# Patient Record
Sex: Male | Born: 2000 | Race: Black or African American | Hispanic: No | Marital: Single | Smoking: Never smoker
Health system: Southern US, Community
[De-identification: ages and names within clinical notes are randomized; demographics above are authoritative.]

## PROBLEM LIST (undated history)

## (undated) DIAGNOSIS — T7840XA Allergy, unspecified, initial encounter: Secondary | ICD-10-CM

## (undated) DIAGNOSIS — F329 Major depressive disorder, single episode, unspecified: Secondary | ICD-10-CM

## (undated) DIAGNOSIS — D649 Anemia, unspecified: Secondary | ICD-10-CM

## (undated) DIAGNOSIS — K5190 Ulcerative colitis, unspecified, without complications: Secondary | ICD-10-CM

## (undated) DIAGNOSIS — Z5189 Encounter for other specified aftercare: Secondary | ICD-10-CM

## (undated) DIAGNOSIS — F909 Attention-deficit hyperactivity disorder, unspecified type: Secondary | ICD-10-CM

## (undated) DIAGNOSIS — J189 Pneumonia, unspecified organism: Secondary | ICD-10-CM

## (undated) DIAGNOSIS — L309 Dermatitis, unspecified: Secondary | ICD-10-CM

## (undated) DIAGNOSIS — F32A Depression, unspecified: Secondary | ICD-10-CM

## (undated) DIAGNOSIS — F419 Anxiety disorder, unspecified: Secondary | ICD-10-CM

## (undated) HISTORY — DX: Encounter for other specified aftercare: Z51.89

## (undated) HISTORY — PX: NO PAST SURGERIES: SHX2092

## (undated) HISTORY — DX: Anxiety disorder, unspecified: F41.9

## (undated) HISTORY — DX: Depression, unspecified: F32.A

## (undated) HISTORY — DX: Pneumonia, unspecified organism: J18.9

## (undated) HISTORY — DX: Ulcerative colitis, unspecified, without complications: K51.90

## (undated) HISTORY — DX: Major depressive disorder, single episode, unspecified: F32.9

## (undated) HISTORY — DX: Allergy, unspecified, initial encounter: T78.40XA

## (undated) HISTORY — DX: Anemia, unspecified: D64.9

---
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
## 2020-02-02 ENCOUNTER — Encounter: Payer: Self-pay | Admitting: Family Medicine

## 2020-02-02 ENCOUNTER — Ambulatory Visit (INDEPENDENT_AMBULATORY_CARE_PROVIDER_SITE_OTHER): Payer: Managed Care, Other (non HMO) | Admitting: Family Medicine

## 2020-02-02 ENCOUNTER — Other Ambulatory Visit: Payer: Self-pay

## 2020-02-02 VITALS — BP 122/81 | HR 72 | Temp 98.4°F | Wt 180.0 lb

## 2020-02-02 DIAGNOSIS — F4323 Adjustment disorder with mixed anxiety and depressed mood: Secondary | ICD-10-CM | POA: Diagnosis not present

## 2020-02-02 NOTE — Progress Notes (Signed)
BP 122/81   Pulse 72   Temp 98.4 F (36.9 C) (Oral)   Wt 180 lb (81.6 kg)   SpO2 98%    Subjective:    Patient ID: Aaron Sawyer, male    DOB: 07/31/2000, 19 y.o.   MRN: [id number]  HPI: Aaron Sawyer is a 19 y.o. male  Chief Complaint  Patient presents with  . Anxiety  . Depression   ANXIETY/DEPRESSION- has been having a lot of family issues that have been pushing on him and making him not feel good, has been feeling down and has been anxious around new people  Duration: couple of months Status:exacerbated Anxious mood: yes  Excessive worrying: yes Irritability: yes  Sweating: no Nausea: no Palpitations:no Hyperventilation: no Panic attacks: no Agoraphobia: no  Obscessions/compulsions: no Depressed mood: yes Depression screen [facility] 2/9 02/02/2020 10/18/2018 03/16/2017 01/05/2017  Decreased Interest 1 1 0 3  Down, Depressed, Hopeless 1 0 0 0  PHQ - 2 Score 2 1 0 3  Altered sleeping 0 0 0 0  Tired, decreased energy 1 0 0 0  Change in appetite 0 0 0 0  Feeling bad or failure about yourself  1 0 0 0  Trouble concentrating 0 0 0 0  Moving slowly or fidgety/restless 0 0 0 0  Suicidal thoughts 0 0 0 0  PHQ-9 Score 4 1 0 3  Difficult doing work/chores Somewhat difficult Not difficult at all Not difficult at all Somewhat difficult   GAD 7 : Generalized Anxiety Score 02/02/2020 10/18/2018  Nervous, Anxious, on Edge 1 0  Control/stop worrying 1 0  Worry too much - different things 1 0  Trouble relaxing 0 0  Restless 1 0  Easily annoyed or irritable 0 0  Afraid - awful might happen 1 0  Total GAD 7 Score 5 0  Anxiety Difficulty Somewhat difficult Not difficult at all   Anhedonia: no Weight changes: no Insomnia: no   Hypersomnia: no Fatigue/loss of energy: yes Feelings of worthlessness: no Feelings of guilt: no Impaired concentration/indecisiveness: no Suicidal ideations: no  Crying spells: no Recent Stressors/Life Changes: yes   Relationship problems: no   Family  stress: yes     Financial stress: no    Job stress: no    Recent death/loss: yes  Relevant past medical, surgical, family and social history reviewed and updated as indicated. Interim medical history since our last visit reviewed. Allergies and medications reviewed and updated.  Review of Systems  Constitutional: Negative.   Respiratory: Negative.   Cardiovascular: Negative.   Gastrointestinal: Negative.   Musculoskeletal: Negative.   Psychiatric/Behavioral: Negative.     Per HPI unless specifically indicated above     Objective:    BP 122/81   Pulse 72   Temp 98.4 F (36.9 C) (Oral)   Wt 180 lb (81.6 kg)   SpO2 98%   Wt Readings from Last 3 Encounters:  02/02/20 180 lb (81.6 kg) (83 %, Z= 0.93)*  10/18/18 172 lb (78 kg) (81 %, Z= 0.87)*  09/20/17 173 lb (78.5 kg) (87 %, Z= 1.11)*   * Growth percentiles are based on CDC (Boys, 2-20 Years) data.    Physical Exam Vitals and nursing note reviewed.  Constitutional:      General: He is not in acute distress.    Appearance: Normal appearance. He is not ill-appearing, toxic-appearing or diaphoretic.  HENT:     Head: Normocephalic and atraumatic.     Right Ear: External ear normal.  Left Ear: External ear normal.     Nose: Nose normal.     Mouth/Throat:     Mouth: Mucous membranes are moist.     Pharynx: Oropharynx is clear.  Eyes:     General: No scleral icterus.       Right eye: No discharge.        Left eye: No discharge.     Extraocular Movements: Extraocular movements intact.     Conjunctiva/sclera: Conjunctivae normal.     Pupils: Pupils are equal, round, and reactive to light.  Cardiovascular:     Rate and Rhythm: Normal rate and regular rhythm.     Pulses: Normal pulses.     Heart sounds: Normal heart sounds. No murmur heard.  No friction rub. No gallop.   Pulmonary:     Effort: Pulmonary effort is normal. No respiratory distress.     Breath sounds: Normal breath sounds. No stridor. No wheezing,  rhonchi or rales.  Chest:     Chest wall: No tenderness.  Musculoskeletal:        General: Normal range of motion.     Cervical back: Normal range of motion and neck supple.  Skin:    General: Skin is warm and dry.     Capillary Refill: Capillary refill takes less than 2 seconds.     Coloration: Skin is not jaundiced or pale.     Findings: No bruising, erythema, lesion or rash.  Neurological:     General: No focal deficit present.     Mental Status: He is alert and oriented to person, place, and time. Mental status is at baseline.  Psychiatric:        Mood and Affect: Mood normal.        Behavior: Behavior normal.        Thought Content: Thought content normal.        Judgment: Judgment normal.     Results for orders placed or performed in visit on 10/23/18  Iron and TIBC  Result Value Ref Range   Total Iron Binding Capacity 344 250 - 450 ug/dL   UIBC 324 148 - 395 ug/dL   Iron 20 (L) 26 - 169 ug/dL   Iron Saturation 6 (LL) 15 - 55 %  Ferritin  Result Value Ref Range   Ferritin 5 (L) 16.0 - 124.0 ng/mL  VITAMIN D 25 Hydroxy (Vit-D Deficiency, Fractures)  Result Value Ref Range   Vit D, 25-Hydroxy 20.0 (L) 30.0 - 100.0 ng/mL  Sed Rate (ESR)  Result Value Ref Range   Sed Rate 12 0 - 15 mm/hr  C-reactive protein  Result Value Ref Range   CRP 4 0 - 7 mg/L      Assessment & Plan:   Problem List Items Addressed This Visit    None    Visit Diagnoses    Adjustment disorder with mixed anxiety and depressed mood    -  Primary   Would like to try therapy. If getting worse, will consider medication. Call with any concerns. Continue to monitor.        Follow up plan: Return in about 4 weeks (around 03/01/2020) for Physical.

## 2020-04-02 ENCOUNTER — Emergency Department
Admission: EM | Admit: 2020-04-02 | Discharge: 2020-04-03 | Disposition: A | Discharge status: Home / Self Care | Payer: 59 | Attending: Emergency Medicine | Admitting: Emergency Medicine

## 2020-04-02 ENCOUNTER — Other Ambulatory Visit: Payer: Self-pay

## 2020-04-02 DIAGNOSIS — R112 Nausea with vomiting, unspecified: Secondary | ICD-10-CM

## 2020-04-02 LAB — URINALYSIS, COMPLETE (UACMP) WITH MICROSCOPIC
Bacteria, UA: NONE SEEN
Bilirubin Urine: NEGATIVE
Glucose, UA: NEGATIVE mg/dL
Hgb urine dipstick: NEGATIVE
Ketones, ur: 20 mg/dL — AB
Leukocytes,Ua: NEGATIVE
Nitrite: NEGATIVE
Protein, ur: 30 mg/dL — AB
Specific Gravity, Urine: 1.028 (ref 1.005–1.030)
pH: 6 (ref 5.0–8.0)

## 2020-04-02 LAB — CBC
HCT: 43.6 % (ref 39.0–52.0)
Hemoglobin: 12.6 g/dL — ABNORMAL LOW (ref 13.0–17.0)
MCH: 20.5 pg — ABNORMAL LOW (ref 26.0–34.0)
MCHC: 28.9 g/dL — ABNORMAL LOW (ref 30.0–36.0)
MCV: 70.9 fL — ABNORMAL LOW (ref 80.0–100.0)
Platelets: 405 10*3/uL — ABNORMAL HIGH (ref 150–400)
RBC: 6.15 MIL/uL — ABNORMAL HIGH (ref 4.22–5.81)
RDW: 18.7 % — ABNORMAL HIGH (ref 11.5–15.5)
WBC: 10.5 10*3/uL (ref 4.0–10.5)
nRBC: 0 % (ref 0.0–0.2)

## 2020-04-02 LAB — LIPASE, BLOOD: Lipase: 23 U/L (ref 11–51)

## 2020-04-02 LAB — COMPREHENSIVE METABOLIC PANEL
ALT: 12 U/L (ref 0–44)
AST: 21 U/L (ref 15–41)
Albumin: 4.4 g/dL (ref 3.5–5.0)
Alkaline Phosphatase: 69 U/L (ref 38–126)
Anion gap: 14 (ref 5–15)
BUN: 12 mg/dL (ref 6–20)
CO2: 21 mmol/L — ABNORMAL LOW (ref 22–32)
Calcium: 8.7 mg/dL — ABNORMAL LOW (ref 8.9–10.3)
Chloride: 101 mmol/L (ref 98–111)
Creatinine, Ser: 0.99 mg/dL (ref 0.61–1.24)
GFR, Estimated: >60 mL/min (ref >60)
Glucose, Bld: 93 mg/dL (ref 70–99)
Potassium: 3.6 mmol/L (ref 3.5–5.1)
Sodium: 136 mmol/L (ref 135–145)
Total Bilirubin: 0.7 mg/dL (ref 0.3–1.2)
Total Protein: 8.9 g/dL — ABNORMAL HIGH (ref 6.5–8.1)

## 2020-04-02 MED ORDER — ONDANSETRON HCL 4 MG PO TABS: 4.0000 mg | ORAL_TABLET | Freq: Three times a day (TID) | ORAL | 0 refills | Status: DC | PRN

## 2020-04-02 MED ORDER — ONDANSETRON HCL 4 MG/2ML IJ SOLN
4.0000 mg | Freq: Once | INTRAMUSCULAR | Status: AC
  Administered 2020-04-02: 4 mg via INTRAVENOUS
  Filled 2020-04-02: qty 2

## 2020-04-02 MED ORDER — SODIUM CHLORIDE 0.9 % IV BOLUS
1000.0000 mL | Freq: Once | INTRAVENOUS | Status: AC
Start: 2020-04-02 — End: 2020-04-02
  Administered 2020-04-02: 1000 mL via INTRAVENOUS

## 2020-04-02 NOTE — ED Provider Notes (Signed)
[facility] Emergency Department Provider Note  ____________________________________________   I have reviewed the triage vital signs and the nursing notes.   HISTORY  Chief Complaint Emesis   History limited by: Not Limited   HPI Aaron Sawyer is a 19 y.o. male who presents to the emergency department today because of concern for nausea and vomiting. Symptoms started early this morning. Woke patient from sleep. Initial episode consisted of the [employer] he had eaten the night before and subsequent episodes have been green and brown. Denies any blood. Denies any significant pain but has developed some discomfort in his upper abdomen. Denies any diarrhea or fever. The patient has history of ulcerative colitis but these symptoms are different to how that normally presents.    Records reviewed. Per medical record review patient has a history of ulcerative colitis.   Past Medical History:  Diagnosis Date  . ADHD   . Allergy   . Anemia   . Anxiety   . Blood transfusion without reported diagnosis   . Depression   . Eczema   . Pneumonia 07/2015   Albuterol, z-pack  . Ulcerative colitis [facility])     Patient Active Problem List   Diagnosis Date Noted  . UC (ulcerative colitis) ([place]) 12/08/2016  . ADD (attention deficit disorder) 12/08/2016  . Grief 12/08/2016  . Acute anxiety 12/08/2016  . Severe protein-calorie malnutrition ([place]) 11/29/2016  . Iron deficiency anemia 11/29/2016  . Chronic diarrhea 06/23/2016    Past Surgical History:  Procedure Laterality Date  . NO PAST SURGERIES      Prior to Admission medications   Medication Sig Start Date End Date Taking? Authorizing Provider  Cholecalciferol (VITAMIN D3) 1.25 MG (50000 UT) CAPS TAKE 1 CAPSULE, 50,000 UNITS TOTAL, BY MOUTH ONCE A WEEK FOR 8 WEEKS. 05/14/18   [provider]  mesalamine (LIALDA) 1.2 g EC tablet Take by mouth daily with breakfast.    [provider]  triamcinolone  ointment (KENALOG) 0.5 % Apply 1 application topically 2 (two) times daily. 10/18/18   Valerie Roys, DO    Allergies Patient has no known allergies.  Family History  Problem Relation Age of Onset  . Hypertension Father   . Cancer Maternal Grandfather   . Hypertension Maternal Grandfather   . Hypertension Maternal Grandmother   . Hypertension Paternal Grandmother   . Depression Paternal Grandmother     Social History Social History   Tobacco Use  . Smoking status: Never Smoker  . Smokeless tobacco: Never Used  Vaping Use  . Vaping Use: Never used  Substance Use Topics  . Alcohol use: No  . Drug use: No    Review of Systems Constitutional: No fever/chills Eyes: No visual changes. ENT: No sore throat. Cardiovascular: Denies chest pain. Respiratory: Denies shortness of breath. Gastrointestinal: Positive for nausea and vomiting.  Genitourinary: Negative for dysuria. Musculoskeletal: Negative for back pain. Skin: Negative for rash. Neurological: Negative for headaches, focal weakness or numbness.  ____________________________________________   PHYSICAL EXAM:  VITAL SIGNS: ED Triage Vitals  Enc Vitals Group     BP 04/02/20 1814 118/72     Pulse Rate 04/02/20 1814 (!) 116     Resp 04/02/20 1814 18     Temp 04/02/20 1814 99.8 F (37.7 C)     Temp Source 04/02/20 1814 Oral     SpO2 04/02/20 1814 98 %     Weight 04/02/20 1815 183 lb (83 kg)     Height 04/02/20 1815 5'  11" (1.803 m)     Head Circumference --      Peak Flow --      Pain Score 04/02/20 1815 4   Constitutional: Alert and oriented.  Eyes: Conjunctivae are normal.  ENT      Head: Normocephalic and atraumatic.      Nose: No congestion/rhinnorhea.      Mouth/Throat: Mucous membranes are moist.      Neck: No stridor. Hematological/Lymphatic/Immunilogical: No cervical lymphadenopathy. Cardiovascular: Normal rate, regular rhythm.  No murmurs, rubs, or gallops.  Respiratory: Normal respiratory effort  without tachypnea nor retractions. Breath sounds are clear and equal bilaterally. No wheezes/rales/rhonchi. Gastrointestinal: Soft and non tender. No rebound. No guarding.  Genitourinary: Deferred Musculoskeletal: Normal range of motion in all extremities. No lower extremity edema. Neurologic:  Normal speech and language. No gross focal neurologic deficits are appreciated.  Skin:  Skin is warm, dry and intact. No rash noted. Psychiatric: Mood and affect are normal. Speech and behavior are normal. Patient exhibits appropriate insight and judgment.  ____________________________________________    LABS (pertinent positives/negatives)  Lipase 23 CMP wnl except co2 21, ca 8.7, t pro 8.9 CBC wbc 10.5, hgb 12.6, plt 405 UA hazy, ketones 20, protein 30, leukocytes negative, 0-5 wbc  ____________________________________________   EKG  None  ____________________________________________    RADIOLOGY  None  ____________________________________________   PROCEDURES  Procedures  ____________________________________________   INITIAL IMPRESSION / ASSESSMENT AND PLAN / ED COURSE  Pertinent labs & imaging results that were available during my care of the patient were reviewed by me and considered in my medical decision making (see chart for details).   Patient presented to the emergency department today because of concerns for nausea and vomiting that started early this morning.  On exam patient's abdomen is benign.  Patient without any active vomiting in the room.  Blood work without concerning leukocytosis or electrolyte abnormality.  Will however give patient some IV fluids given tachycardia.  Will give patient antiemetics. Will reassess and if patient is feeling better and able to tolerate PO do think it would be reasonable to discharge.  ___________________________________________   FINAL CLINICAL IMPRESSION(S) / ED DIAGNOSES  Final diagnoses:  Nausea and vomiting,  intractability of vomiting not specified, unspecified vomiting type     Note: This dictation was prepared with Dragon dictation. Any transcriptional errors that result from this process are unintentional     Nance Pear, MD 04/02/20 2240

## 2020-04-02 NOTE — Discharge Instructions (Addendum)
Please seek medical attention for any high fevers, chest pain, shortness of breath, change in behavior, persistent vomiting, bloody stool or any other new or concerning symptoms.  

## 2020-04-02 NOTE — ED Triage Notes (Signed)
PT to ED via POV c/o emesis since about 1am. States he thinks he had some bad chicken. No diarrhea. Mild abd pain pt states he thinks from retching.

## 2020-04-03 NOTE — ED Notes (Signed)
Patient given 6 ounces ginger ale for PO challenge

## 2020-04-06 ENCOUNTER — Encounter: Payer: Self-pay | Admitting: Family Medicine

## 2020-04-06 ENCOUNTER — Other Ambulatory Visit: Payer: Self-pay

## 2020-04-06 ENCOUNTER — Ambulatory Visit (INDEPENDENT_AMBULATORY_CARE_PROVIDER_SITE_OTHER): Payer: 59 | Admitting: Family Medicine

## 2020-04-06 VITALS — BP 133/73 | HR 90 | Temp 98.2°F | Ht 70.25 in | Wt 174.6 lb

## 2020-04-06 DIAGNOSIS — Z0000 Encounter for general adult medical examination without abnormal findings: Secondary | ICD-10-CM | POA: Diagnosis not present

## 2020-04-06 DIAGNOSIS — Z23 Encounter for immunization: Secondary | ICD-10-CM

## 2020-04-06 LAB — URINALYSIS, ROUTINE W REFLEX MICROSCOPIC
Bilirubin, UA: NEGATIVE
Glucose, UA: NEGATIVE
Leukocytes,UA: NEGATIVE
Nitrite, UA: NEGATIVE
RBC, UA: NEGATIVE
Specific Gravity, UA: 1.025 (ref 1.005–1.030)
Urobilinogen, Ur: 0.2 mg/dL (ref 0.2–1.0)
pH, UA: 6 (ref 5.0–7.5)

## 2020-04-06 LAB — MICROSCOPIC EXAMINATION
Bacteria, UA: NONE SEEN
RBC: NONE SEEN /hpf (ref 0–2)
WBC, UA: NONE SEEN /hpf (ref 0–5)

## 2020-04-06 NOTE — Progress Notes (Signed)
BP 133/73   Pulse 90   Temp 98.2 F (36.8 C)   Ht 5' 10.25" (1.784 m)   Wt 174 lb 9.6 oz (79.2 kg)   SpO2 98%   BMI 24.87 kg/m    Subjective:    Patient ID: Aaron Sawyer, male    DOB: 2000-05-25, 19 y.o.   MRN: [id number]  HPI: Aaron Sawyer is a 19 y.o. male presenting on 04/06/2020 for comprehensive medical examination. Current medical complaints include:  Had to go to ER due to food poisoning couple of days ago. Better now.   Interim Problems from his last visit: no  Depression Screen done today and results listed below:  Depression screen [facility] 2/9 04/06/2020 02/02/2020 10/18/2018 03/16/2017 01/05/2017  Decreased Interest 0 1 1 0 3  Down, Depressed, Hopeless 0 1 0 0 0  PHQ - 2 Score 0 2 1 0 3  Altered sleeping - 0 0 0 0  Tired, decreased energy - 1 0 0 0  Change in appetite - 0 0 0 0  Feeling bad or failure about yourself  - 1 0 0 0  Trouble concentrating - 0 0 0 0  Moving slowly or fidgety/restless - 0 0 0 0  Suicidal thoughts - 0 0 0 0  PHQ-9 Score - 4 1 0 3  Difficult doing work/chores - Somewhat difficult Not difficult at all Not difficult at all Somewhat difficult   Past Medical History:  Past Medical History:  Diagnosis Date  . ADHD   . Allergy   . Anemia   . Anxiety   . Blood transfusion without reported diagnosis   . Depression   . Eczema   . Pneumonia 07/2015   Albuterol, z-pack  . Ulcerative colitis [facility])     Surgical History:  Past Surgical History:  Procedure Laterality Date  . NO PAST SURGERIES      Medications:  Current Outpatient Medications on File Prior to Visit  Medication Sig  . Cholecalciferol (VITAMIN D3) 1.25 MG (50000 UT) CAPS TAKE 1 CAPSULE, 50,000 UNITS TOTAL, BY MOUTH ONCE A WEEK FOR 8 WEEKS.  . mesalamine (LIALDA) 1.2 g EC tablet Take by mouth daily with breakfast.  . ondansetron (ZOFRAN) 4 MG tablet Take 1 tablet (4 mg total) by mouth every 8 (eight) hours as needed. (Patient not taking: Reported on 04/06/2020)  . triamcinolone  ointment (KENALOG) 0.5 % Apply 1 application topically 2 (two) times daily. (Patient not taking: Reported on 04/06/2020)   No current facility-administered medications on file prior to visit.    Allergies:  No Known Allergies  Social History:  Social History   Socioeconomic History  . Marital status: Single    Spouse name: Not on file  . Number of children: Not on file  . Years of education: Not on file  . Highest education level: Not on file  Occupational History  . Not on file  Tobacco Use  . Smoking status: Never Smoker  . Smokeless tobacco: Never Used  Vaping Use  . Vaping Use: Never used  Substance and Sexual Activity  . Alcohol use: No  . Drug use: No  . Sexual activity: Not on file  Other Topics Concern  . Not on file  Social History Narrative  . Not on file   Social Determinants of Health   Financial Resource Strain:   . Difficulty of Paying Living Expenses: Not on file  Food Insecurity:   . Worried About Charity fundraiser in the  Last Year: Not on file  . Ran Out of Food in the Last Year: Not on file  Transportation Needs:   . Lack of Transportation (Medical): Not on file  . Lack of Transportation (Non-Medical): Not on file  Physical Activity:   . Days of Exercise per Week: Not on file  . Minutes of Exercise per Session: Not on file  Stress:   . Feeling of Stress : Not on file  Social Connections:   . Frequency of Communication with Friends and Family: Not on file  . Frequency of Social Gatherings with Friends and Family: Not on file  . Attends Religious Services: Not on file  . Active Member of Clubs or Organizations: Not on file  . Attends Archivist Meetings: Not on file  . Marital Status: Not on file  Intimate Partner Violence:   . Fear of Current or Ex-Partner: Not on file  . Emotionally Abused: Not on file  . Physically Abused: Not on file  . Sexually Abused: Not on file   Social History   Tobacco Use  Smoking Status Never  Smoker  Smokeless Tobacco Never Used   Social History   Substance and Sexual Activity  Alcohol Use No    Family History:  Family History  Problem Relation Age of Onset  . Hypertension Father   . Cancer Maternal Grandfather   . Hypertension Maternal Grandfather   . Hypertension Maternal Grandmother   . Hypertension Paternal Grandmother   . Depression Paternal Grandmother     Past medical history, surgical history, medications, allergies, family history and social history reviewed with patient today and changes made to appropriate areas of the chart.   Review of Systems  Constitutional: Negative.   HENT: Negative.   Eyes: Negative.   Respiratory: Negative.   Cardiovascular: Negative.   Gastrointestinal: Negative.   Genitourinary: Negative.   Musculoskeletal: Negative.   Skin: Negative.   Neurological: Negative.   Endo/Heme/Allergies: Negative.   Psychiatric/Behavioral: Negative.     All other ROS negative except what is listed above and in the HPI.      Objective:    BP 133/73   Pulse 90   Temp 98.2 F (36.8 C)   Ht 5' 10.25" (1.784 m)   Wt 174 lb 9.6 oz (79.2 kg)   SpO2 98%   BMI 24.87 kg/m   Wt Readings from Last 3 Encounters:  04/06/20 174 lb 9.6 oz (79.2 kg) (77 %, Z= 0.75)*  04/02/20 183 lb (83 kg) (84 %, Z= 1.00)*  02/02/20 180 lb (81.6 kg) (83 %, Z= 0.93)*   * Growth percentiles are based on CDC (Boys, 2-20 Years) data.    Physical Exam Vitals and nursing note reviewed.  Constitutional:      General: He is not in acute distress.    Appearance: Normal appearance. He is normal weight. He is not ill-appearing, toxic-appearing or diaphoretic.  HENT:     Head: Normocephalic and atraumatic.     Right Ear: Tympanic membrane, ear canal and external ear normal. There is no impacted cerumen.     Left Ear: Tympanic membrane, ear canal and external ear normal. There is no impacted cerumen.     Nose: Nose normal. No congestion or rhinorrhea.      Mouth/Throat:     Mouth: Mucous membranes are moist.     Pharynx: Oropharynx is clear. No oropharyngeal exudate or posterior oropharyngeal erythema.  Eyes:     General: No scleral icterus.  Right eye: No discharge.        Left eye: No discharge.     Extraocular Movements: Extraocular movements intact.     Conjunctiva/sclera: Conjunctivae normal.     Pupils: Pupils are equal, round, and reactive to light.  Neck:     Vascular: No carotid bruit.  Cardiovascular:     Rate and Rhythm: Normal rate and regular rhythm.     Pulses: Normal pulses.     Heart sounds: No murmur heard.  No friction rub. No gallop.   Pulmonary:     Effort: Pulmonary effort is normal. No respiratory distress.     Breath sounds: Normal breath sounds. No stridor. No wheezing, rhonchi or rales.  Chest:     Chest wall: No tenderness.  Abdominal:     General: Abdomen is flat. Bowel sounds are normal. There is no distension.     Palpations: Abdomen is soft. There is no mass.     Tenderness: There is no abdominal tenderness. There is no right CVA tenderness, left CVA tenderness, guarding or rebound.     Hernia: No hernia is present.  Genitourinary:    Comments: Genital exam deferred with shared decision making Musculoskeletal:        General: No swelling, tenderness, deformity or signs of injury.     Cervical back: Normal range of motion and neck supple. No rigidity. No muscular tenderness.     Right lower leg: No edema.     Left lower leg: No edema.  Lymphadenopathy:     Cervical: No cervical adenopathy.  Skin:    General: Skin is warm and dry.     Capillary Refill: Capillary refill takes less than 2 seconds.     Coloration: Skin is not jaundiced or pale.     Findings: No bruising, erythema, lesion or rash.  Neurological:     General: No focal deficit present.     Mental Status: He is alert and oriented to person, place, and time.     Cranial Nerves: No cranial nerve deficit.     Sensory: No sensory  deficit.     Motor: No weakness.     Coordination: Coordination normal.     Gait: Gait normal.     Deep Tendon Reflexes: Reflexes normal.  Psychiatric:        Mood and Affect: Mood normal.        Behavior: Behavior normal.        Thought Content: Thought content normal.        Judgment: Judgment normal.     Results for orders placed or performed during the hospital encounter of 04/02/20  Lipase, blood  Result Value Ref Range   Lipase 23 11 - 51 U/L  Comprehensive metabolic panel  Result Value Ref Range   Sodium 136 135 - 145 mmol/L   Potassium 3.6 3.5 - 5.1 mmol/L   Chloride 101 98 - 111 mmol/L   CO2 21 (L) 22 - 32 mmol/L   Glucose, Bld 93 70 - 99 mg/dL   BUN 12 6 - 20 mg/dL   Creatinine, Ser 0.99 0.61 - 1.24 mg/dL   Calcium 8.7 (L) 8.9 - 10.3 mg/dL   Total Protein 8.9 (H) 6.5 - 8.1 g/dL   Albumin 4.4 3.5 - 5.0 g/dL   AST 21 15 - 41 U/L   ALT 12 0 - 44 U/L   Alkaline Phosphatase 69 38 - 126 U/L   Total Bilirubin 0.7 0.3 - 1.2 mg/dL   GFR,  Estimated >60 >60 mL/min   Anion gap 14 5 - 15  CBC  Result Value Ref Range   WBC 10.5 4.0 - 10.5 K/uL   RBC 6.15 (H) 4.22 - 5.81 MIL/uL   Hemoglobin 12.6 (L) 13.0 - 17.0 g/dL   HCT 43.6 39 - 52 %   MCV 70.9 (L) 80.0 - 100.0 fL   MCH 20.5 (L) 26.0 - 34.0 pg   MCHC 28.9 (L) 30.0 - 36.0 g/dL   RDW 18.7 (H) 11.5 - 15.5 %   Platelets 405 (H) 150 - 400 K/uL   nRBC 0.0 0.0 - 0.2 %  Urinalysis, Complete w Microscopic Urine, Clean Catch  Result Value Ref Range   Color, Urine YELLOW (A) YELLOW   APPearance HAZY (A) CLEAR   Specific Gravity, Urine 1.028 1.005 - 1.030   pH 6.0 5.0 - 8.0   Glucose, UA NEGATIVE NEGATIVE mg/dL   Hgb urine dipstick NEGATIVE NEGATIVE   Bilirubin Urine NEGATIVE NEGATIVE   Ketones, ur 20 (A) NEGATIVE mg/dL   Protein, ur 30 (A) NEGATIVE mg/dL   Nitrite NEGATIVE NEGATIVE   Leukocytes,Ua NEGATIVE NEGATIVE   WBC, UA 0-5 0 - 5 WBC/hpf   Bacteria, UA NONE SEEN NONE SEEN   Squamous Epithelial / LPF 0-5 0 - 5    Mucus PRESENT       Assessment & Plan:   Problem List Items Addressed This Visit    None    Visit Diagnoses    Routine general medical examination at a health care facility    -  Primary   Vaccines up to date. Screening labs checked today. No concerns about STIs. Conitnue diet and exercise. Call with any concerns.    Relevant Orders   CBC with Differential/Platelet   Comprehensive metabolic panel   Lipid Panel w/o Chol/HDL Ratio   TSH   Urinalysis, Routine w reflex microscopic   Need for influenza vaccination       Flu shot given today.   Relevant Orders   Flu Vaccine QUAD 6+ mos PF IM (Fluarix Quad PF)       LABORATORY TESTING:  Health maintenance labs ordered today as discussed above.   IMMUNIZATIONS:   - Tdap: Tetanus vaccination status reviewed: last tetanus booster within 10 years. - Influenza: Administered today - Pneumovax: Not applicable - Prevnar: Up to date - HPV: Up to date - COVID vaccine: Up to date  PATIENT COUNSELING:    Sexuality: Discussed sexually transmitted diseases, partner selection, use of condoms, avoidance of unintended pregnancy  and contraceptive alternatives.   Advised to avoid cigarette smoking.  I discussed with the patient that most people either abstain from alcohol or drink within safe limits (<=14/week and <=4 drinks/occasion for males, <=7/weeks and <= 3 drinks/occasion for females) and that the risk for alcohol disorders and other health effects rises proportionally with the number of drinks per week and how often a drinker exceeds daily limits.  Discussed cessation/primary prevention of drug use and availability of treatment for abuse.   Diet: Encouraged to adjust caloric intake to maintain  or achieve ideal body weight, to reduce intake of dietary saturated fat and total fat, to limit sodium intake by avoiding high sodium foods and not adding table salt, and to maintain adequate dietary potassium and calcium preferably from fresh  fruits, vegetables, and low-fat dairy products.    stressed the importance of regular exercise  Injury prevention: Discussed safety belts, safety helmets, smoke detector, smoking near bedding or upholstery.  Dental health: Discussed importance of regular tooth brushing, flossing, and dental visits.   Follow up plan: NEXT PREVENTATIVE PHYSICAL DUE IN 1 YEAR. Return in about 1 year (around 04/06/2021) for physical.

## 2020-04-07 LAB — COMPREHENSIVE METABOLIC PANEL
ALT: 11 IU/L (ref 0–44)
AST: 20 IU/L (ref 0–40)
Albumin/Globulin Ratio: 1.2 (ref 1.2–2.2)
Albumin: 4.2 g/dL (ref 4.1–5.2)
Alkaline Phosphatase: 76 IU/L (ref 51–125)
BUN/Creatinine Ratio: 9 (ref 9–20)
BUN: 9 mg/dL (ref 6–20)
Bilirubin Total: <0.2 mg/dL (ref 0.0–1.2)
CO2: 24 mmol/L (ref 20–29)
Calcium: 9.2 mg/dL (ref 8.7–10.2)
Chloride: 104 mmol/L (ref 96–106)
Creatinine, Ser: 1.02 mg/dL (ref 0.76–1.27)
GFR calc Af Amer: 123 mL/min/{1.73_m2} (ref >59)
GFR calc non Af Amer: 106 mL/min/{1.73_m2} (ref >59)
Globulin, Total: 3.4 g/dL (ref 1.5–4.5)
Glucose: 82 mg/dL (ref 65–99)
Potassium: 4 mmol/L (ref 3.5–5.2)
Sodium: 141 mmol/L (ref 134–144)
Total Protein: 7.6 g/dL (ref 6.0–8.5)

## 2020-04-07 LAB — LIPID PANEL W/O CHOL/HDL RATIO
Cholesterol, Total: 89 mg/dL — ABNORMAL LOW (ref 100–169)
HDL: 26 mg/dL — ABNORMAL LOW (ref >39)
LDL Chol Calc (NIH): 48 mg/dL (ref 0–109)
Triglycerides: 67 mg/dL (ref 0–89)
VLDL Cholesterol Cal: 15 mg/dL (ref 5–40)

## 2020-04-07 LAB — CBC WITH DIFFERENTIAL/PLATELET
Basophils Absolute: 0 x10E3/uL (ref 0.0–0.2)
Basos: 1 %
EOS (ABSOLUTE): 0.1 x10E3/uL (ref 0.0–0.4)
Eos: 2 %
Hematocrit: 39.8 % (ref 37.5–51.0)
Hemoglobin: 11.8 g/dL — ABNORMAL LOW (ref 13.0–17.7)
Immature Grans (Abs): 0 x10E3/uL (ref 0.0–0.1)
Immature Granulocytes: 0 %
Lymphocytes Absolute: 1.3 x10E3/uL (ref 0.7–3.1)
Lymphs: 16 %
MCH: 20.7 pg — ABNORMAL LOW (ref 26.6–33.0)
MCHC: 29.6 g/dL — ABNORMAL LOW (ref 31.5–35.7)
MCV: 70 fL — ABNORMAL LOW (ref 79–97)
Monocytes Absolute: 0.8 x10E3/uL (ref 0.1–0.9)
Monocytes: 11 %
Neutrophils Absolute: 5.5 x10E3/uL (ref 1.4–7.0)
Neutrophils: 70 %
Platelets: 437 x10E3/uL (ref 150–450)
RBC: 5.71 x10E6/uL (ref 4.14–5.80)
RDW: 16.7 % — ABNORMAL HIGH (ref 11.6–15.4)
WBC: 7.7 x10E3/uL (ref 3.4–10.8)

## 2020-04-07 LAB — TSH: TSH: 0.464 u[IU]/mL (ref 0.450–4.500)

## 2020-07-06 ENCOUNTER — Emergency Department
Admission: EM | Admit: 2020-07-06 | Discharge: 2020-07-06 | Disposition: A | Discharge status: Home / Self Care | Payer: No Typology Code available for payment source | Attending: Emergency Medicine | Admitting: Emergency Medicine

## 2020-07-06 ENCOUNTER — Emergency Department: Payer: No Typology Code available for payment source

## 2020-07-06 ENCOUNTER — Other Ambulatory Visit: Payer: Self-pay

## 2020-07-06 DIAGNOSIS — R1030 Lower abdominal pain, unspecified: Secondary | ICD-10-CM | POA: Insufficient documentation

## 2020-07-06 DIAGNOSIS — K921 Melena: Secondary | ICD-10-CM | POA: Diagnosis not present

## 2020-07-06 DIAGNOSIS — K51919 Ulcerative colitis, unspecified with unspecified complications: Secondary | ICD-10-CM

## 2020-07-06 LAB — CBC
HCT: 42.4 % (ref 39.0–52.0)
Hemoglobin: 12.3 g/dL — ABNORMAL LOW (ref 13.0–17.0)
MCH: 20.8 pg — ABNORMAL LOW (ref 26.0–34.0)
MCHC: 29 g/dL — ABNORMAL LOW (ref 30.0–36.0)
MCV: 71.6 fL — ABNORMAL LOW (ref 80.0–100.0)
Platelets: 381 10*3/uL (ref 150–400)
RBC: 5.92 MIL/uL — ABNORMAL HIGH (ref 4.22–5.81)
RDW: 17.7 % — ABNORMAL HIGH (ref 11.5–15.5)
WBC: 11.3 10*3/uL — ABNORMAL HIGH (ref 4.0–10.5)
nRBC: 0 % (ref 0.0–0.2)

## 2020-07-06 LAB — COMPREHENSIVE METABOLIC PANEL
ALT: 11 U/L (ref 0–44)
AST: 18 U/L (ref 15–41)
Albumin: 3.9 g/dL (ref 3.5–5.0)
Alkaline Phosphatase: 64 U/L (ref 38–126)
Anion gap: 7 (ref 5–15)
BUN: 8 mg/dL (ref 6–20)
CO2: 27 mmol/L (ref 22–32)
Calcium: 9.1 mg/dL (ref 8.9–10.3)
Chloride: 101 mmol/L (ref 98–111)
Creatinine, Ser: 0.97 mg/dL (ref 0.61–1.24)
GFR, Estimated: >60 mL/min (ref >60)
Glucose, Bld: 93 mg/dL (ref 70–99)
Potassium: 3.3 mmol/L — ABNORMAL LOW (ref 3.5–5.1)
Sodium: 135 mmol/L (ref 135–145)
Total Bilirubin: 0.4 mg/dL (ref 0.3–1.2)
Total Protein: 8.4 g/dL — ABNORMAL HIGH (ref 6.5–8.1)

## 2020-07-06 LAB — LIPASE, BLOOD: Lipase: 28 U/L (ref 11–51)

## 2020-07-06 MED ORDER — HYDROCODONE-ACETAMINOPHEN 5-325 MG PO TABS: 1.0000 | ORAL_TABLET | ORAL | 0 refills | Status: AC | PRN

## 2020-07-06 MED ORDER — MORPHINE SULFATE (PF) 2 MG/ML IV SOLN
2.0000 mg | Freq: Once | INTRAVENOUS | Status: AC
  Administered 2020-07-06: 2 mg via INTRAVENOUS
  Filled 2020-07-06: qty 1

## 2020-07-06 MED ORDER — HYDROCODONE-ACETAMINOPHEN 5-325 MG PO TABS
1.0000 | ORAL_TABLET | Freq: Once | ORAL | Status: AC
  Administered 2020-07-06: 1 via ORAL
  Filled 2020-07-06: qty 1

## 2020-07-06 MED ORDER — SODIUM CHLORIDE 0.9 % IV BOLUS
1000.0000 mL | Freq: Once | INTRAVENOUS | Status: AC
  Administered 2020-07-06: 1000 mL via INTRAVENOUS

## 2020-07-06 MED ORDER — ONDANSETRON HCL 4 MG/2ML IJ SOLN
4.0000 mg | Freq: Once | INTRAMUSCULAR | Status: AC
  Administered 2020-07-06: 4 mg via INTRAVENOUS
  Filled 2020-07-06: qty 2

## 2020-07-06 MED ORDER — IOHEXOL 9 MG/ML PO SOLN
500.0000 mL | ORAL | Status: AC
  Administered 2020-07-06: 500 mL via ORAL

## 2020-07-06 MED ORDER — MESALAMINE 1.2 G PO TBEC: 2.4000 g | DELAYED_RELEASE_TABLET | Freq: Every day | ORAL | 0 refills | Status: DC

## 2020-07-06 MED ORDER — IOHEXOL 300 MG/ML  SOLN
100.0000 mL | Freq: Once | INTRAMUSCULAR | Status: AC | PRN
  Administered 2020-07-06: 100 mL via INTRAVENOUS

## 2020-07-06 NOTE — ED Provider Notes (Signed)
[facility] Emergency Department Provider Note  Time seen: 11:44 AM  I have reviewed the triage vital signs and the nursing notes.   HISTORY  Chief Complaint Abdominal Pain   HPI Aaron Sawyer is a 20 y.o. male with a past medical history of ADHD, anemia, anxiety, ulcerative colitis, presents to the emergency department for lower abdominal pain and intermittent blood within loose stool.  According to the patient he was diagnosed with ulcerative colitis 3 years ago.  States he will intermittently have blood in his stool, over the last 3 days he has been experiencing lower abdominal pain as well.  Denies any fever.  No nausea or vomiting.  Largely negative review of systems otherwise.  Describes abdominal pain is mild to moderate dull pain across the lower abdomen more so on the left side.   Past Medical History:  Diagnosis Date  . ADHD   . Allergy   . Anemia   . Anxiety   . Blood transfusion without reported diagnosis   . Depression   . Eczema   . Pneumonia 07/2015   Albuterol, z-pack  . Ulcerative colitis [facility])     Patient Active Problem List   Diagnosis Date Noted  . UC (ulcerative colitis) ([place]) 12/08/2016  . ADD (attention deficit disorder) 12/08/2016  . Grief 12/08/2016  . Acute anxiety 12/08/2016  . Severe protein-calorie malnutrition ([place]) 11/29/2016  . Iron deficiency anemia 11/29/2016  . Chronic diarrhea 06/23/2016    Past Surgical History:  Procedure Laterality Date  . NO PAST SURGERIES      Prior to Admission medications   Medication Sig Start Date End Date Taking? Authorizing Provider  Cholecalciferol (VITAMIN D3) 1.25 MG (50000 UT) CAPS TAKE 1 CAPSULE, 50,000 UNITS TOTAL, BY MOUTH ONCE A WEEK FOR 8 WEEKS. 05/14/18   [provider]  mesalamine (LIALDA) 1.2 g EC tablet Take by mouth daily with breakfast.    [provider]  ondansetron (ZOFRAN) 4 MG tablet Take 1 tablet (4 mg total) by mouth every 8 (eight) hours as  needed. Patient not taking: Reported on 04/06/2020 04/02/20   Nance Pear, MD  triamcinolone ointment (KENALOG) 0.5 % Apply 1 application topically 2 (two) times daily. Patient not taking: Reported on 04/06/2020 10/18/18   Park Liter P, DO    No Known Allergies  Family History  Problem Relation Age of Onset  . Hypertension Father   . Cancer Maternal Grandfather   . Hypertension Maternal Grandfather   . Hypertension Maternal Grandmother   . Hypertension Paternal Grandmother   . Depression Paternal Grandmother     Social History Social History   Tobacco Use  . Smoking status: Never Smoker  . Smokeless tobacco: Never Used  Vaping Use  . Vaping Use: Never used  Substance Use Topics  . Alcohol use: No  . Drug use: No    Review of Systems Constitutional: Negative for fever. Cardiovascular: Negative for chest pain. Respiratory: Negative for shortness of breath. Gastrointestinal: Positive for lower abdominal pain.  Negative for vomiting.  Positive for loose stool with occasional blood. Genitourinary: Negative for urinary compaints Musculoskeletal: Negative for musculoskeletal complaints Neurological: Negative for headache All other ROS negative  ____________________________________________   PHYSICAL EXAM:  VITAL SIGNS: ED Triage Vitals  Enc Vitals Group     BP 07/06/20 1010 (!) 131/92     Pulse Rate 07/06/20 1010 99     Resp 07/06/20 1010 16     Temp 07/06/20 1010 99 F (37.2 C)  Temp Source 07/06/20 1010 Oral     SpO2 07/06/20 1010 98 %     Weight 07/06/20 1010 170 lb (77.1 kg)     Height 07/06/20 1010 5' 10"  (1.778 m)     Head Circumference --      Peak Flow --      Pain Score 07/06/20 0954 7     Pain Loc --      Pain Edu? --      Excl. in [place]? --     Constitutional: Alert and oriented. Well appearing and in no distress. Eyes: Normal exam ENT      Head: Normocephalic and atraumatic.      Mouth/Throat: Mucous membranes are  moist. Cardiovascular: Normal rate, regular rhythm.  Respiratory: Normal respiratory effort without tachypnea nor retractions. Breath sounds are clear  Gastrointestinal: Soft, mild suprapubic tenderness palpation without rebound guarding or distention.  Abdomen otherwise benign. Musculoskeletal: Nontender with normal range of motion in all extremities.  Neurologic:  Normal speech and language. No gross focal neurologic deficits Skin:  Skin is warm, dry and intact.  Psychiatric: Mood and affect are normal.  ____________________________________________     RADIOLOGY  CT shows fairly diffuse colitis.  Likely due to ulcerative colitis.  ____________________________________________   INITIAL IMPRESSION / ASSESSMENT AND PLAN / ED COURSE  Pertinent labs & imaging results that were available during my care of the patient were reviewed by me and considered in my medical decision making (see chart for details).   Patient presents to the emergency department for lower abdominal pain and occasional blood within his loose stool.  History of ulcerative colitis.  Mild tenderness palpation.  Lab work shows slight leukocytosis otherwise largely normal labs including an unchanged H&H.  We will proceed with CT imaging to further evaluate.  Patient agreeable to plan of care.  Patient follows up with GI medicine at [facility].  CT shows fairly diffuse colitis which is likely related to his underlying ulcerative colitis.  I discussed with the patient need to follow-up with his gastroenterologist at [facility].  Patient agreeable to plan of care.  We will prescribe mesalamine and Norco for the patient.  I discussed return precautions.  Patient agreeable to plan of care.  Aaron Sawyer was evaluated in Emergency Department on 07/06/2020 for the symptoms described in the history of present illness. He was evaluated in the context of the global COVID-19 pandemic, which necessitated consideration that the patient might be at risk  for infection with the SARS-CoV-2 virus that causes COVID-19. Institutional protocols and algorithms that pertain to the evaluation of patients at risk for COVID-19 are in a state of rapid change based on information released by regulatory bodies including the CDC and federal and state organizations. These policies and algorithms were followed during the patient's care in the ED.  ____________________________________________   FINAL CLINICAL IMPRESSION(S) / ED DIAGNOSES  Lower abdominal pain   Harvest Dark, MD 07/06/20 1507

## 2020-07-06 NOTE — Discharge Instructions (Signed)
Please take your medication as prescribed.  Please follow-up with your gastroenterologist within the next 1 to 2 days for recheck/reevaluation.  Please inform them of today's ER visit your CT findings and your prescription.  Return to the emergency department for any worsening abdominal pain, fever, or any other symptom personally concerning to yourself.

## 2020-07-06 NOTE — ED Triage Notes (Addendum)
Pt comes with c/o abdominal pain that started two days ago. Pt denies any N/V. Pt states some diarrhea. Pt states hx of ulcerative colitis.

## 2021-03-08 ENCOUNTER — Other Ambulatory Visit: Payer: Self-pay

## 2021-03-08 NOTE — Telephone Encounter (Signed)
Patient last seen 04/06/20 and has appointment 04/12/21

## 2021-04-12 ENCOUNTER — Encounter: Payer: 59 | Admitting: Family Medicine

## 2022-03-13 DIAGNOSIS — F332 Major depressive disorder, recurrent severe without psychotic features: Secondary | ICD-10-CM | POA: Diagnosis not present

## 2022-03-13 DIAGNOSIS — F329 Major depressive disorder, single episode, unspecified: Secondary | ICD-10-CM | POA: Diagnosis not present

## 2022-03-13 DIAGNOSIS — F32A Depression, unspecified: Secondary | ICD-10-CM | POA: Diagnosis not present

## 2022-03-13 DIAGNOSIS — Z1152 Encounter for screening for COVID-19: Secondary | ICD-10-CM | POA: Diagnosis not present

## 2022-03-13 DIAGNOSIS — R45851 Suicidal ideations: Secondary | ICD-10-CM | POA: Diagnosis not present

## 2022-03-14 DIAGNOSIS — R45851 Suicidal ideations: Secondary | ICD-10-CM | POA: Diagnosis not present

## 2022-03-14 DIAGNOSIS — Z62811 Personal history of psychological abuse in childhood: Secondary | ICD-10-CM | POA: Diagnosis not present

## 2022-03-14 DIAGNOSIS — Z91148 Patient's other noncompliance with medication regimen for other reason: Secondary | ICD-10-CM | POA: Diagnosis not present

## 2022-03-14 DIAGNOSIS — F332 Major depressive disorder, recurrent severe without psychotic features: Secondary | ICD-10-CM | POA: Diagnosis not present

## 2022-03-14 DIAGNOSIS — F39 Unspecified mood [affective] disorder: Secondary | ICD-10-CM | POA: Diagnosis not present

## 2022-03-15 ENCOUNTER — Telehealth: Payer: Self-pay

## 2022-03-15 NOTE — Telephone Encounter (Signed)
Transition Care Management Unsuccessful Follow-up Telephone Call  Date of discharge and from where:  03/14/22, [place]  Attempts:  1st Attempt  Reason for unsuccessful TCM follow-up call:  Left voice message

## 2022-03-16 NOTE — Telephone Encounter (Signed)
Transition Care Management Unsuccessful Follow-up Telephone Call  Date of discharge and from where:  03/14/22, [place]  Attempts:  2nd Attempt  Reason for unsuccessful TCM follow-up call:  Left voice message

## 2022-03-16 NOTE — Telephone Encounter (Signed)
Transition Care Management Unsuccessful Follow-up Telephone Call  Date of discharge and from where:  03/14/22  Attempts:  3rd Attempt  Reason for unsuccessful TCM follow-up call:  Left voice message

## 2022-04-11 ENCOUNTER — Encounter: Payer: Self-pay | Admitting: Physician Assistant

## 2022-06-05 DIAGNOSIS — R519 Headache, unspecified: Secondary | ICD-10-CM | POA: Diagnosis not present

## 2022-06-05 DIAGNOSIS — J069 Acute upper respiratory infection, unspecified: Secondary | ICD-10-CM | POA: Diagnosis not present

## 2022-06-05 DIAGNOSIS — R6883 Chills (without fever): Secondary | ICD-10-CM | POA: Diagnosis not present

## 2022-06-06 ENCOUNTER — Emergency Department
Admission: EM | Admit: 2022-06-06 | Discharge: 2022-06-06 | Disposition: A | Discharge status: Home / Self Care | Payer: BC Managed Care – PPO | Attending: Emergency Medicine | Admitting: Emergency Medicine

## 2022-06-06 ENCOUNTER — Encounter: Payer: Self-pay | Admitting: Emergency Medicine

## 2022-06-06 ENCOUNTER — Other Ambulatory Visit: Payer: Self-pay

## 2022-06-06 DIAGNOSIS — B9789 Other viral agents as the cause of diseases classified elsewhere: Secondary | ICD-10-CM | POA: Diagnosis not present

## 2022-06-06 DIAGNOSIS — Z20822 Contact with and (suspected) exposure to covid-19: Secondary | ICD-10-CM | POA: Diagnosis not present

## 2022-06-06 DIAGNOSIS — R509 Fever, unspecified: Secondary | ICD-10-CM | POA: Diagnosis not present

## 2022-06-06 DIAGNOSIS — J069 Acute upper respiratory infection, unspecified: Secondary | ICD-10-CM | POA: Diagnosis not present

## 2022-06-06 LAB — RESP PANEL BY RT-PCR (RSV, FLU A&B, COVID)  RVPGX2
Influenza A by PCR: NEGATIVE
Influenza B by PCR: NEGATIVE
Resp Syncytial Virus by PCR: NEGATIVE
SARS Coronavirus 2 by RT PCR: NEGATIVE

## 2022-06-06 MED ORDER — ACETAMINOPHEN 500 MG PO TABS
1000.0000 mg | ORAL_TABLET | ORAL | Status: AC
  Administered 2022-06-06: 1000 mg via ORAL
  Filled 2022-06-06: qty 2

## 2022-06-06 MED ORDER — ONDANSETRON 4 MG PO TBDP
4.0000 mg | ORAL_TABLET | Freq: Once | ORAL | Status: AC
  Administered 2022-06-06: 4 mg via ORAL
  Filled 2022-06-06: qty 1

## 2022-06-06 NOTE — ED Provider Triage Note (Signed)
Emergency Medicine Provider Triage Evaluation Note  Aaron Sawyer , a 22 y.o. male  was evaluated in triage.  Pt complains of reports a history of ulcerative colitis, uses Humira  Had a bit of a dry cough 3 days ago which is now resolved but since then has been having mild bodyaches slight nausea no vomiting or diarrhea.  Denies abdominal pain.  Reports also slight runny nose.  No shortness of breath.  Review of Systems  Positive: Some fevers chills slight achiness.  Dry cough 3 days ago which is resolved Negative: Abdominal pain chest pain trouble breathing  Physical Exam  Ht 5\' 11"  (1.803 m)   Wt 80.3 kg   BMI 24.69 kg/m  Gen:   Awake, no distress   Resp:  Normal effort clear lungs bilaterally Normal rate and heart tones MSK:   Moves extremities without difficulty and ambulates independently without difficulty Other:  Nucha's membranes are moist.  Patient nontoxic well-appearing in no distress.  Normal work of breathing  Medical Decision Making  Medically screening exam initiated at 9:18 AM.  Appropriate orders placed.  Aaron Sawyer was informed that the remainder of the evaluation will be completed by another provider, this initial triage assessment does not replace that evaluation, and the importance of remaining in the ED until their evaluation is complete.     Delman Kitten, MD 06/06/22 [phone]

## 2022-06-06 NOTE — ED Triage Notes (Addendum)
Pt via POV from home. Pt c/o fever, chills, nausea, cough for the past 3 days. Denies pain. States that he has been taking Alka-Seltzer cold and flu. Pt is A&Ox4 and NAD

## 2022-06-06 NOTE — ED Provider Notes (Signed)
[facility] Provider Note    Event Date/Time   First MD Initiated Contact with Patient 06/06/22 1023     (approximate)   History   Fever and Chills   HPI  ZYGMUND PASSERO is a 22 y.o. male   presents to the ED with complaint of fever, chills, nausea and cough for the last 3 days.  He states he has been taking Alka-Seltzer cold and flu with relief of his cough symptoms but still continues to have a low-grade temp of 99.  Patient is unaware of any known sick contacts.  Patient has history of ADD, iron deficiency anemia, UC, and eczema.      Physical Exam   Triage Vital Signs: ED Triage Vitals  Enc Vitals Group     BP 06/06/22 0923 119/88     Pulse Rate 06/06/22 0923 84     Resp 06/06/22 0923 18     Temp 06/06/22 0923 98.6 F (37 C)     Temp Source 06/06/22 0923 Oral     SpO2 06/06/22 0923 100 %     Weight 06/06/22 0917 177 lb (80.3 kg)     Height 06/06/22 0917 5\' 11"  (1.803 m)     Head Circumference --      Peak Flow --      Pain Score 06/06/22 0917 0     Pain Loc --      Pain Edu? --      Excl. in [place]? --     Most recent vital signs: Vitals:   06/06/22 0923  BP: 119/88  Pulse: 84  Resp: 18  Temp: 98.6 F (37 C)  SpO2: 100%     General: Awake, no distress.  CV:  Good peripheral perfusion.  Heart regular rate and rhythm. Resp:  Normal effort.  Lungs are clear bilaterally. Abd:  No distention.  Other:  Posterior pharynx without erythema, exudate or tonsillar enlargement.  No cervical lymphadenopathy appreciated.   ED Results / Procedures / Treatments   Labs (all labs ordered are listed, but only abnormal results are displayed) Labs Reviewed  RESP PANEL BY RT-PCR (RSV, FLU A&B, COVID)  [id number]     PROCEDURES:  Critical Care performed:   Procedures   MEDICATIONS ORDERED IN ED: Medications  ondansetron (ZOFRAN-ODT) disintegrating tablet 4 mg (4 mg Oral Given 06/06/22 0927)  acetaminophen (TYLENOL) tablet 1,000 mg (1,000 mg  Oral Given 06/06/22 0927)     IMPRESSION / MDM / ASSESSMENT AND PLAN / ED COURSE  I reviewed the triage vital signs and the nursing notes.   Differential diagnosis includes, but is not limited to, COVID, influenza, RSV, viral upper respiratory infection.  22 year old male presents to the ED with upper respiratory symptoms.  Patient has been taking Alka-Seltzer cold and flu for the last 3 days and states that has helped some with his cough and congestion.  He was made aware that his influenza, COVID and RSV test are negative.  He is encouraged to continue with his over-the-counter medication, Tylenol, ibuprofen and continue to drink fluids to stay hydrated.  Follow-up with his PCP or urgent care if any continued problems.      Patient's presentation is most consistent with acute complicated illness / injury requiring diagnostic workup.  FINAL CLINICAL IMPRESSION(S) / ED DIAGNOSES   Final diagnoses:  Viral URI     Rx / DC Orders   ED Discharge Orders     None  Note:  This document was prepared using Dragon voice recognition software and may include unintentional dictation errors.   Johnn Hai, PA-C 06/06/22 1346    Harvest Dark, MD 06/06/22 1455

## 2022-06-06 NOTE — Discharge Instructions (Addendum)
Follow-up with your primary care provider if any continued problems or concerns.  Continue with your over-the-counter medication for your upper respiratory symptoms.  You may also take Tylenol or ibuprofen as needed for body aches or fever.  Drink lots of fluids to stay hydrated.  Return to the emergency department if any severe worsening of your symptoms.

## 2022-07-12 ENCOUNTER — Other Ambulatory Visit: Payer: Self-pay

## 2022-07-12 ENCOUNTER — Emergency Department
Admission: EM | Admit: 2022-07-12 | Discharge: 2022-07-12 | Disposition: A | Discharge status: Home / Self Care | Payer: BC Managed Care – PPO | Attending: Emergency Medicine | Admitting: Emergency Medicine

## 2022-07-12 DIAGNOSIS — R519 Headache, unspecified: Secondary | ICD-10-CM | POA: Diagnosis not present

## 2022-07-12 DIAGNOSIS — R112 Nausea with vomiting, unspecified: Secondary | ICD-10-CM | POA: Insufficient documentation

## 2022-07-12 DIAGNOSIS — R110 Nausea: Secondary | ICD-10-CM

## 2022-07-12 MED ORDER — ONDANSETRON 8 MG PO TBDP: 8.0000 mg | ORAL_TABLET | Freq: Three times a day (TID) | ORAL | 0 refills | Status: DC | PRN

## 2022-07-12 NOTE — ED Triage Notes (Signed)
Pt states has episode of headache and nausea earlier today. Here for nausea medcs.

## 2022-07-12 NOTE — ED Provider Notes (Signed)
[facility] Provider Note   Event Date/Time   First MD Initiated Contact with Patient 07/12/22 1559     (approximate) History  Headache  HPI Aaron Sawyer is a 22 y.o. male with a stated past medical history migraines who presents complaining of headache over the last 3 days as well as associated nausea that began earlier today with 1 episode of vomiting.  Patient states he has a history of similar headaches with associated symptoms and states that he is just here for nausea medicine. ROS: Patient currently denies any vision changes, tinnitus, difficulty speaking, facial droop, sore throat, chest pain, shortness of breath, abdominal pain, diarrhea, dysuria, or weakness/numbness/paresthesias in any extremity   Physical Exam  Triage Vital Signs: ED Triage Vitals  Enc Vitals Group     BP 07/12/22 1412 128/76     Pulse Rate 07/12/22 1410 71     Resp 07/12/22 1410 18     Temp 07/12/22 1410 98.1 F (36.7 C)     Temp Source 07/12/22 1410 Oral     SpO2 07/12/22 1410 98 %     Weight 07/12/22 1411 172 lb (78 kg)     Height 07/12/22 1411 '5\' 11"'$  (1.803 m)     Head Circumference --      Peak Flow --      Pain Score 07/12/22 1411 5     Pain Loc --      Pain Edu? --      Excl. in [place]? --    Most recent vital signs: Vitals:   07/12/22 1410 07/12/22 1412  BP:  128/76  Pulse: 71   Resp: 18   Temp: 98.1 F (36.7 C)   SpO2: 98%    General: Awake, oriented x4. CV:  Good peripheral perfusion.  Resp:  Normal effort.  Abd:  No distention.  Other:  Young adult African-American male laying in bed in no acute distress ED Results / Procedures / Treatments  PROCEDURES: Critical Care performed: No Procedures MEDICATIONS ORDERED IN ED: Medications - No data to display IMPRESSION / MDM / [place] / ED COURSE  I reviewed the triage vital signs and the nursing notes.                             The patient is on the cardiac monitor to evaluate for evidence of  arrhythmia and/or significant heart rate changes. Patient's presentation is most consistent with acute presentation with potential threat to life or bodily function. Presents with Headache.  No focal neurological symptoms. Neuro exam is benign. Pt is nontoxic. VSS.  Based on history and normal neurological exam I have low suspicion for intracranial tumor, intracranial bleed, meningitis, temporal arteritis, glaucoma, CO poisoning.  Most likely patient has benign headache, recommend rest, hydration, and ibuprofen.  Disposition: Discharge home with strict return precautions and instructions for prompt primary care follow up.   FINAL CLINICAL IMPRESSION(S) / ED DIAGNOSES   Final diagnoses:  Acute nonintractable headache, unspecified headache type  Nausea   Rx / DC Orders   ED Discharge Orders          Ordered    ondansetron (ZOFRAN-ODT) 8 MG disintegrating tablet  Every 8 hours PRN        07/12/22 1631           Note:  This document was prepared using Dragon voice recognition software and may include unintentional dictation errors.  Naaman Plummer, MD 07/12/22 [phone]

## 2022-07-17 ENCOUNTER — Telehealth: Payer: Self-pay

## 2022-07-17 NOTE — Transitions of Care (Post Inpatient/ED Visit) (Signed)
   07/17/2022  Name: SIRCHARLES SANDVIK MRN: [id number] DOB: 27-Oct-2000  Today's TOC FU Call Status: Today's TOC FU Call Status:: Unsuccessul Call (1st Attempt) Unsuccessful Call (1st Attempt) Date: 07/17/22  Attempted to reach the patient regarding the most recent Inpatient/ED visit.  Follow Up Plan: Additional outreach attempts will be made to reach the patient to complete the Transitions of Care (Post Inpatient/ED visit) call.   Signature Irena Reichmann, [place]

## 2022-07-17 NOTE — Telephone Encounter (Signed)
-----   Message from Georgina Peer, [place] sent at 07/14/2022  1:28 PM EST ----- Needs TOC completed please.

## 2022-07-24 NOTE — Transitions of Care (Post Inpatient/ED Visit) (Signed)
   07/24/2022  Name: ANUJ SUMMONS MRN: [id number] DOB: April 20, 2001  Today's TOC FU Call Status: Today's TOC FU Call Status:: Successful TOC FU Call Competed Unsuccessful Call (1st Attempt) Date: 07/17/22 [facility] FU Call Complete Date: 07/24/22  Transition Care Management Follow-up Telephone Call Date of Discharge: 07/12/22 Discharge Facility[facility] Type of Discharge: Emergency Department How have you been since you were released from the hospital?: Better Any questions or concerns?: No  Items Reviewed: Did you receive and understand the discharge instructions provided?: Yes Medications obtained and verified?: Yes (Medications Reviewed) Any new allergies since your discharge?: No Dietary orders reviewed?: NA Do you have support at home?: No  Home Care and Equipment/Supplies: [place] Ordered?: NA Any new equipment or medical supplies ordered?: NA  Functional Questionnaire: Do you need assistance with bathing/showering or dressing?: No Do you need assistance with meal preparation?: No Do you need assistance with eating?: No Do you have difficulty maintaining continence: No Do you need assistance with getting out of bed/getting out of a chair/moving?: No Do you have difficulty managing or taking your medications?: No  Folllow up appointments reviewed: PCP Follow-up appointment confirmed?: NA Specialist Hospital Follow-up appointment confirmed?: NA Do you need transportation to your follow-up appointment?: No Do you understand care options if your condition(s) worsen?: Yes-patient verbalized understanding    [place], [place]

## 2022-08-28 DIAGNOSIS — K5100 Ulcerative (chronic) pancolitis without complications: Secondary | ICD-10-CM | POA: Diagnosis not present

## 2022-08-28 DIAGNOSIS — Z23 Encounter for immunization: Secondary | ICD-10-CM | POA: Diagnosis not present

## 2022-08-28 DIAGNOSIS — Z79899 Other long term (current) drug therapy: Secondary | ICD-10-CM | POA: Diagnosis not present

## 2022-08-30 DIAGNOSIS — K5100 Ulcerative (chronic) pancolitis without complications: Secondary | ICD-10-CM | POA: Diagnosis not present

## 2022-11-20 ENCOUNTER — Ambulatory Visit: Payer: Self-pay

## 2022-11-20 NOTE — Telephone Encounter (Signed)
  Chief Complaint: depression Symptoms: hopelessness, sadness, trouble falling asleep, stays up late , mood swings, "melancholy", occasionally overindulges with social drinking Frequency: past month  Pertinent Negatives: Patient denies suicidal ideation or plan - homicidal ideation, drug use  Disposition: [] ED /[] Urgent Care (no appt availability in office) / [x] Appointment(In office/virtual)/ []  [facility]/ [] Home Care/ [] Refused Recommended Disposition /[] [facility]/ []  Follow-up with PCP Additional Notes: no appts in time frame. Next available appt made with Erin Mecum for Friday.   Sent pt mental health resources via MyChart. Pt will need work note for appt.  Reason for Disposition  [1] Depression AND [2] worsening (e.g., sleeping poorly, less able to do activities of daily living)  Answer Assessment - Initial Assessment Questions 1. CONCERN: "What happened that made you call today?"     Depression, anxiety  2. DEPRESSION SYMPTOM SCREENING: "How are you feeling overall?" (e.g., decreased energy, increased sleeping or difficulty sleeping, difficulty concentrating, feelings of sadness, guilt, hopelessness, or worthlessness)     Sadness, hopelessness, having going to sleep at night, sleeping, mood swings, melancholy 3. RISK OF HARM - SUICIDAL IDEATION:  "Do you ever have thoughts of hurting or killing yourself?"  (e.g., yes, no, no but preoccupation with thoughts about death)   - INTENT:  "Do you have thoughts of hurting or killing yourself right NOW?" (e.g., yes, no, N/A)   - PLAN: "Do you have a specific plan for how you would do this?" (e.g., gun, knife, overdose, no plan, N/A)     no 4. RISK OF HARM - HOMICIDAL IDEATION:  "Do you ever have thoughts of hurting or killing someone else?"  (e.g., yes, no, no but preoccupation with thoughts about death)   - INTENT:  "Do you have thoughts of hurting or killing someone right NOW?" (e.g., yes, no, N/A)   - PLAN: "Do you  have a specific plan for how you would do this?" (e.g., gun, knife, no plan, N/A)      no 5. FUNCTIONAL IMPAIRMENT: "How have things been going for you overall? Have you had more difficulty than usual doing your normal daily activities?"  (e.g., better, same, worse; self-care, school, work, interactions)     Self isolation, angry at times "temperamental"  6. SUPPORT: "Who is with you now?" "Who do you live with?" "Do you have family or friends who you can talk to?"      Best friend, mom, (living with momma) 7. THERAPIST: "Do you have a counselor or therapist? Name?"     No  8. STRESSORS: "Has there been any new stress or recent changes in your life?"     Move in with mother- has a "rocky" relationship with mother 1 month ago- unsettled   9. ALCOHOL USE OR SUBSTANCE USE (DRUG USE): "Do you drink alcohol or use any illegal drugs?"     Alcohol - socially or for an occasion can do a little too much  10. OTHER: "Do you have any other physical symptoms right now?" (e.g., fever)       Eczema ,  Protocols used: Depression-A-AH

## 2022-11-21 NOTE — Telephone Encounter (Signed)
Pt scheduled to come in 7/12

## 2022-11-24 ENCOUNTER — Encounter: Payer: Self-pay | Admitting: Physician Assistant

## 2022-11-24 ENCOUNTER — Ambulatory Visit: Payer: BC Managed Care – PPO | Admitting: Physician Assistant

## 2022-11-24 VITALS — BP 125/73 | HR 72 | Ht 71.0 in | Wt 158.6 lb

## 2022-11-24 DIAGNOSIS — F4323 Adjustment disorder with mixed anxiety and depressed mood: Secondary | ICD-10-CM

## 2022-11-24 MED ORDER — VENLAFAXINE HCL ER 37.5 MG PO CP24
37.5000 mg | ORAL_CAPSULE | Freq: Every day | ORAL | 1 refills | Status: DC
Start: 2022-11-24 — End: 2022-12-18

## 2022-11-24 NOTE — Progress Notes (Signed)
Acute Office Visit   Patient: Aaron Sawyer   DOB: 2000-10-09   22 y.o. Male  MRN: [id number] Visit Date: 11/24/2022  Today's healthcare provider: Oswaldo Conroy Kunal Levario, PA-C  Introduced myself to the patient as a Secondary school teacher and provided education on APPs in clinical practice.    Chief Complaint  Patient presents with   Depression   Subjective    HPI   DEPRESSION  He reports he has always had depression and anxiety since his brother passed about 7 years ago  States recently he has felt moody, irritable,  He reports in Oct he was sent to a behavioral facility due suicide attempt  He denies being on medication for Depression in the past  He has been on anxiety medication PRN in the past  He denies being evaluated for Bipolar disorder in the past or dx of this condition   Mood status: uncontrolled Satisfied with current treatment?:  NA Symptom severity: moderate  Duration of current treatment :  NA  Side effects: no Medication compliance:  NA Psychotherapy/counseling: Not right now but has in the past- is open to referral for this today  Depressed mood: yes Anxious mood: yes Anhedonia: no Significant weight loss or gain: yes- has lost about 20 lbs since Feb Insomnia: yes hard to fall asleep Fatigue: yes Feelings of worthlessness or guilt: yes Impaired concentration/indecisiveness: yes- reports indecision  Suicidal ideations: no Hopelessness: no Crying spells: no    11/24/2022    4:35 PM 04/06/2020    8:25 AM 02/02/2020    8:50 AM 10/18/2018    3:45 PM 03/16/2017   10:53 AM  Depression screen PHQ 2/9  Decreased Interest 1 0 1 1 0  Down, Depressed, Hopeless 1 0 1 0 0  PHQ - 2 Score 2 0 2 1 0  Altered sleeping 3  0 0 0  Tired, decreased energy 2  1 0 0  Change in appetite 2  0 0 0  Feeling bad or failure about yourself  2  1 0 0  Trouble concentrating 1  0 0 0  Moving slowly or fidgety/restless 0  0 0 0  Suicidal thoughts 0  0 0 0  PHQ-9 Score 12  4 1  0  Difficult doing  work/chores Somewhat difficult  Somewhat difficult Not difficult at all Not difficult at all      11/24/2022    4:35 PM 02/02/2020    8:51 AM 10/18/2018    3:44 PM  GAD 7 : Generalized Anxiety Score  Nervous, Anxious, on Edge 1 1 0  Control/stop worrying 3 1 0  Worry too much - different things 3 1 0  Trouble relaxing 0 0 0  Restless 0 1 0  Easily annoyed or irritable 2 0 0  Afraid - awful might happen 3 1 0  Total GAD 7 Score 12 5 0  Anxiety Difficulty Somewhat difficult Somewhat difficult Not difficult at all      Medications: Outpatient Medications Prior to Visit  Medication Sig   HUMIRA, 2 PEN, 40 MG/0.4ML pen SMARTSIG:40 Milligram(s) SUB-Q Every 2 Weeks   Cholecalciferol (VITAMIN D3) 1.25 MG (50000 UT) CAPS TAKE 1 CAPSULE, 50,000 UNITS TOTAL, BY MOUTH ONCE A WEEK FOR 8 WEEKS. (Patient not taking: Reported on 11/24/2022)   mesalamine (LIALDA) 1.2 g EC tablet Take 2 tablets (2.4 g total) by mouth daily with breakfast. (Patient not taking: Reported on 11/24/2022)   ondansetron (ZOFRAN-ODT) 8 MG disintegrating  tablet Take 1 tablet (8 mg total) by mouth every 8 (eight) hours as needed for nausea or vomiting. (Patient not taking: Reported on 11/24/2022)   No facility-administered medications prior to visit.    Review of Systems  Respiratory:  Negative for shortness of breath.   Cardiovascular:  Negative for chest pain and palpitations.  Psychiatric/Behavioral:  Positive for decreased concentration, dysphoric mood and sleep disturbance. Negative for self-injury and suicidal ideas. The patient is nervous/anxious.          Objective    BP 125/73   Pulse 72   Ht 5\' 11"  (1.803 m)   Wt 158 lb 9.6 oz (71.9 kg)   SpO2 99%   BMI 22.12 kg/m      Physical Exam Vitals reviewed.  Constitutional:      General: He is awake.     Appearance: Normal appearance. He is well-developed and well-groomed.  HENT:     Head: Normocephalic and atraumatic.  Cardiovascular:     Rate and  Rhythm: Normal rate and regular rhythm.     Pulses: Normal pulses.          Radial pulses are 2+ on the right side and 2+ on the left side.     Heart sounds: Normal heart sounds. No murmur heard.    No friction rub. No gallop.  Pulmonary:     Effort: Pulmonary effort is normal.     Breath sounds: Normal breath sounds. No decreased air movement. No decreased breath sounds, wheezing, rhonchi or rales.  Musculoskeletal:     Right lower leg: No edema.     Left lower leg: No edema.  Neurological:     General: No focal deficit present.     Mental Status: He is alert and oriented to person, place, and time.     GCS: GCS eye subscore is 4. GCS verbal subscore is 5. GCS motor subscore is 6.     Cranial Nerves: No dysarthria or facial asymmetry.  Psychiatric:        Attention and Perception: Attention and perception normal.        Mood and Affect: Mood and affect normal.        Speech: Speech normal.        Behavior: Behavior normal. Behavior is cooperative.        Thought Content: Thought content normal.        Cognition and Memory: Cognition normal.       No results found for any visits on 11/24/22.  Assessment & Plan      Return in about 6 weeks (around 01/05/2023) for Depression/Anxiety .       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    Unsure of chronicity, acutely exacerbated at this time per patient  He is interested in starting a medication today for management He is amenable to referral to therapy services today- referral placed  We reviewed SNRIs and SSRIs along with common side effect profiles, mechanisms of action and mood effects - through shared decision making we decided on starting SNRI  Will send in script for Venlafaxine 37.5 mg PO every day  Will follow up in about 6 weeks to assess response to medication and provide further refills Call sooner if concerns arise with medication or  mood       Relevant Medications   venlafaxine XR (EFFEXOR XR) 37.5 MG 24  hr capsule   Other Relevant Orders   Ambulatory referral to Psychology     Return in about 6 weeks (around 01/05/2023) for Depression/Anxiety .   I, Aldahir Litaker E Demitrios Molyneux, PA-C, have reviewed all documentation for this visit. The documentation on 11/28/22 for the exam, diagnosis, procedures, and orders are all accurate and complete.   Jacquelin Hawking, MHS, PA-C [facility]

## 2022-11-28 DIAGNOSIS — F339 Major depressive disorder, recurrent, unspecified: Secondary | ICD-10-CM | POA: Insufficient documentation

## 2022-11-28 DIAGNOSIS — F4323 Adjustment disorder with mixed anxiety and depressed mood: Secondary | ICD-10-CM | POA: Insufficient documentation

## 2022-11-28 NOTE — Assessment & Plan Note (Signed)
Unsure of chronicity, acutely exacerbated at this time per patient  He is interested in starting a medication today for management He is amenable to referral to therapy services today- referral placed  We reviewed SNRIs and SSRIs along with common side effect profiles, mechanisms of action and mood effects - through shared decision making we decided on starting SNRI  Will send in script for Venlafaxine 37.5 mg PO every day  Will follow up in about 6 weeks to assess response to medication and provide further refills Call sooner if concerns arise with medication or mood

## 2022-12-12 DIAGNOSIS — K5150 Left sided colitis without complications: Secondary | ICD-10-CM | POA: Diagnosis not present

## 2022-12-12 DIAGNOSIS — K610 Anal abscess: Secondary | ICD-10-CM | POA: Diagnosis not present

## 2022-12-12 DIAGNOSIS — K6289 Other specified diseases of anus and rectum: Secondary | ICD-10-CM | POA: Diagnosis not present

## 2022-12-12 DIAGNOSIS — K51518 Left sided colitis with other complication: Secondary | ICD-10-CM | POA: Diagnosis not present

## 2022-12-12 DIAGNOSIS — K51511 Left sided colitis with rectal bleeding: Secondary | ICD-10-CM | POA: Diagnosis not present

## 2022-12-12 DIAGNOSIS — L02215 Cutaneous abscess of perineum: Secondary | ICD-10-CM | POA: Diagnosis not present

## 2022-12-12 DIAGNOSIS — K5100 Ulcerative (chronic) pancolitis without complications: Secondary | ICD-10-CM | POA: Diagnosis not present

## 2022-12-16 ENCOUNTER — Other Ambulatory Visit: Payer: Self-pay | Admitting: Physician Assistant

## 2022-12-16 DIAGNOSIS — F4323 Adjustment disorder with mixed anxiety and depressed mood: Secondary | ICD-10-CM

## 2022-12-18 NOTE — Telephone Encounter (Signed)
Requested Prescriptions  Pending Prescriptions Disp Refills   venlafaxine XR (EFFEXOR-XR) 37.5 MG 24 hr capsule [Pharmacy Med Name: VENLAFAXINE HCL ER 37.5 MG CAP] 90 capsule 0    Sig: TAKE 1 CAPSULE BY MOUTH DAILY WITH BREAKFAST.     Psychiatry: Antidepressants - SNRI - desvenlafaxine & venlafaxine Failed - 12/16/2022  1:30 PM      Failed - Cr in normal range and within 360 days    Creatinine, Ser  Date Value Ref Range Status  07/06/2020 0.97 0.61 - 1.24 mg/dL Final         Failed - Lipid Panel in normal range within the last 12 months    Cholesterol, Total  Date Value Ref Range Status  04/06/2020 89 (L) 100 - 169 mg/dL Final   LDL Chol Calc (NIH)  Date Value Ref Range Status  04/06/2020 48 0 - 109 mg/dL Final   HDL  Date Value Ref Range Status  04/06/2020 26 (L) >39 mg/dL Final   Triglycerides  Date Value Ref Range Status  04/06/2020 67 0 - 89 mg/dL Final         Passed - Last BP in normal range    BP Readings from Last 1 Encounters:  11/24/22 125/73         Passed - Valid encounter within last 6 months    Recent Outpatient Visits           3 weeks ago Adjustment disorder with mixed anxiety and depressed mood   [place] [facility] Mecum, Oswaldo Conroy, PA-C   2 years ago Routine general medical examination at a health care facility   [facility] [place], [place] P, DO   2 years ago Adjustment disorder with mixed anxiety and depressed mood   [facility] [place], Megan P, DO   4 years ago Routine general medical examination at a health care facility   [facility] [place], DO   5 years ago Acute anxiety   [place] [facility] [place], Oralia Rud, DO       Future Appointments             In 2 weeks Laural Benes, Oralia Rud, DO [place] [facility]   In 2 weeks Laural Benes, Oralia Rud, DO [place] [facility]

## 2023-01-01 ENCOUNTER — Encounter: Payer: Self-pay | Admitting: Family Medicine

## 2023-01-05 ENCOUNTER — Ambulatory Visit: Payer: BC Managed Care – PPO | Admitting: Family Medicine

## 2023-02-14 ENCOUNTER — Other Ambulatory Visit: Payer: Self-pay | Admitting: Physician Assistant

## 2023-02-14 DIAGNOSIS — F4323 Adjustment disorder with mixed anxiety and depressed mood: Secondary | ICD-10-CM

## 2023-02-15 MED ORDER — VENLAFAXINE HCL ER 37.5 MG PO CP24
37.5000 mg | ORAL_CAPSULE | Freq: Every day | ORAL | 0 refills | Status: DC
Start: 2023-02-15 — End: 2023-06-14

## 2023-05-12 ENCOUNTER — Emergency Department (HOSPITAL_COMMUNITY)
Admission: EM | Admit: 2023-05-12 | Discharge: 2023-05-12 | Disposition: A | Discharge status: Home / Self Care | Payer: BC Managed Care – PPO | Attending: Emergency Medicine | Admitting: Emergency Medicine

## 2023-05-12 ENCOUNTER — Other Ambulatory Visit: Payer: Self-pay

## 2023-05-12 ENCOUNTER — Encounter (HOSPITAL_COMMUNITY): Payer: Self-pay

## 2023-05-12 DIAGNOSIS — R059 Cough, unspecified: Secondary | ICD-10-CM | POA: Diagnosis not present

## 2023-05-12 DIAGNOSIS — Z20822 Contact with and (suspected) exposure to covid-19: Secondary | ICD-10-CM | POA: Diagnosis not present

## 2023-05-12 DIAGNOSIS — J069 Acute upper respiratory infection, unspecified: Secondary | ICD-10-CM | POA: Insufficient documentation

## 2023-05-12 DIAGNOSIS — B9789 Other viral agents as the cause of diseases classified elsewhere: Secondary | ICD-10-CM | POA: Diagnosis not present

## 2023-05-12 DIAGNOSIS — R110 Nausea: Secondary | ICD-10-CM | POA: Diagnosis not present

## 2023-05-12 LAB — RESP PANEL BY RT-PCR (RSV, FLU A&B, COVID)  RVPGX2
Influenza A by PCR: NEGATIVE
Influenza B by PCR: NEGATIVE
Resp Syncytial Virus by PCR: NEGATIVE
SARS Coronavirus 2 by RT PCR: NEGATIVE

## 2023-05-12 MED ORDER — ONDANSETRON 4 MG PO TBDP: 4.0000 mg | ORAL_TABLET | Freq: Three times a day (TID) | ORAL | 0 refills | Status: DC | PRN

## 2023-05-12 NOTE — ED Provider Notes (Signed)
[place] EMERGENCY DEPARTMENT AT [facility] Provider Note   CSN: [id number] Arrival date & time: 05/12/23  4098     History Chief Complaint  Patient presents with   Nasal Congestion        Nausea    Aaron Sawyer is a 22 y.o. male.  Patient with past history significant for ulcerative colitis, chronic diarrhea here with concerns of nasal congestion and nausea.  Reports he began to experience headache, sinus pressure, cough, chills and nausea starting yesterday.  Sick contact recently with a friend with similar symptoms.  Denies any fevers as far as patient is aware.  No vomiting, diarrhea, or abdominal pain at this time.  HPI     Home Medications Prior to Admission medications   Medication Sig Start Date End Date Taking? Authorizing Provider  ondansetron (ZOFRAN-ODT) 4 MG disintegrating tablet Take 1 tablet (4 mg total) by mouth every 8 (eight) hours as needed. 05/12/23  Yes Smitty Knudsen, PA-C  Cholecalciferol (VITAMIN D3) 1.25 MG (50000 UT) CAPS TAKE 1 CAPSULE, 50,000 UNITS TOTAL, BY MOUTH ONCE A WEEK FOR 8 WEEKS. Patient not taking: Reported on 11/24/2022 05/14/18   [provider]  HUMIRA, 2 PEN, 40 MG/0.4ML pen SMARTSIG:40 Milligram(s) SUB-Q Every 2 Weeks    [provider]  mesalamine (LIALDA) 1.2 g EC tablet Take 2 tablets (2.4 g total) by mouth daily with breakfast. Patient not taking: Reported on 11/24/2022 07/06/20   Minna Antis, MD  ondansetron (ZOFRAN-ODT) 8 MG disintegrating tablet Take 1 tablet (8 mg total) by mouth every 8 (eight) hours as needed for nausea or vomiting. Patient not taking: Reported on 11/24/2022 07/12/22   Merwyn Katos, MD  venlafaxine XR (EFFEXOR-XR) 37.5 MG 24 hr capsule Take 1 capsule (37.5 mg total) by mouth daily with breakfast. 02/15/23   Mecum, Erin E, PA-C      Allergies    Patient has no known allergies.    Review of Systems   Review of Systems  Respiratory:  Positive for cough.    Gastrointestinal:  Positive for nausea.  All other systems reviewed and are negative.   Physical Exam Updated Vital Signs BP (!) 145/81   Pulse 91   Temp 98.3 F (36.8 C)   Resp 18   Ht 5\' 11"  (1.803 m)   Wt 72.6 kg   SpO2 100%   BMI 22.32 kg/m  Physical Exam Vitals and nursing note reviewed.  Constitutional:      General: He is not in acute distress.    Appearance: He is well-developed.  HENT:     Head: Normocephalic and atraumatic.  Eyes:     Conjunctiva/sclera: Conjunctivae normal.  Cardiovascular:     Rate and Rhythm: Normal rate and regular rhythm.     Heart sounds: No murmur heard. Pulmonary:     Effort: Pulmonary effort is normal. No respiratory distress.     Breath sounds: Normal breath sounds.  Abdominal:     General: Abdomen is flat. Bowel sounds are normal. There is no distension.     Palpations: Abdomen is soft.     Tenderness: There is no abdominal tenderness. There is no guarding.  Musculoskeletal:        General: No swelling.     Cervical back: Neck supple.  Skin:    General: Skin is warm and dry.     Capillary Refill: Capillary refill takes less than 2 seconds.  Neurological:     Mental Status: He is alert.  Psychiatric:        Mood and Affect: Mood normal.     ED Results / Procedures / Treatments   Labs (all labs ordered are listed, but only abnormal results are displayed) Labs Reviewed  RESP PANEL BY RT-PCR (RSV, FLU A&B, COVID)  [id number]    EKG None  Radiology No results found.  Procedures Procedures   Medications Ordered in ED Medications - No data to display  ED Course/ Medical Decision Making/ A&P                                 Medical Decision Making  This patient presents to the ED for concern of nasal congestion, nausea.  Differential diagnosis includes viral URI, sinusitis, pneumonia, gastroenteritis   Lab Tests:  I Ordered, and personally interpreted labs.  The pertinent results include: Negative for COVID-19,  influenza, RSV   Problem List / ED Course:  Patient with past history significant for ulcerative colitis, chronic diarrhea here with concerns of nasal congestion and nausea.  States that symptoms began rather suddenly yesterday after contact with friend who has been sick with similar symptoms.  Reports associated nasal congestion, sinus pressure, chills, but denies any fever and bodyaches.  No vomiting or diarrhea at this time. Respiratory panel ordered from triage which was negative for COVID-19, influenza and RSV.  I suspect patient likely have another viral URI process ongoing.  No acute indication to suggest that this is sinus infection given 1 day course of symptoms. Advised patient to manage with over-the-counter medications for fever, chills and bodyaches as needed including Tylenol and ibuprofen.  Prescription for Zofran sent to patient's pharmacy for nausea control.  Advised patient return the emergency department if she has any acute changes or new or worsening symptoms.  No other acute concerns at this time.  Patient discharged home in stable condition.  Final Clinical Impression(s) / ED Diagnoses Final diagnoses:  Viral URI with cough  Nausea    Rx / DC Orders ED Discharge Orders          Ordered    ondansetron (ZOFRAN-ODT) 4 MG disintegrating tablet  Every 8 hours PRN        05/12/23 1020              Smitty Knudsen, PA-C 05/12/23 1023

## 2023-05-12 NOTE — ED Triage Notes (Signed)
Pt complaining of headache, sinus pressure/congestion, cough, chills, and nausea. Pt states that it began yesterday with just nasal congestion. Has not had any flu/covid tests done yet.

## 2023-05-12 NOTE — Discharge Instructions (Addendum)
You are seen in the emergency department today with concerns of nasal congestion and nausea.  You are negative for COVID-19, influenza and RSV.  I suspect you likely have a another viral infection that is currently causing her symptoms.  I sent a prescription for Zofran to your pharmacy for nausea control.  You should continue take Tylenol or ibuprofen if needed for fever, chills and bodyaches.  Please, following up with primary care provider.  Return to the ER if symptoms are worsening or new symptoms arise

## 2023-05-17 ENCOUNTER — Telehealth: Payer: Self-pay

## 2023-05-17 NOTE — Transitions of Care (Post Inpatient/ED Visit) (Signed)
   05/17/2023  Name: Aaron Sawyer MRN: [id number] DOB: Jan 30, 2001  Today's TOC FU Call Status: Today's TOC FU Call Status:: Unsuccessful Call (1st Attempt)  Attempted to reach the patient regarding the most recent Inpatient/ED visit.  Follow Up Plan: Additional outreach attempts will be made to reach the patient to complete the Transitions of Care (Post Inpatient/ED visit) call.   Signature: Laymon Metro, CMA

## 2023-06-14 ENCOUNTER — Ambulatory Visit (INDEPENDENT_AMBULATORY_CARE_PROVIDER_SITE_OTHER): Payer: BC Managed Care – PPO | Admitting: Family Medicine

## 2023-06-14 ENCOUNTER — Encounter: Payer: Self-pay | Admitting: Family Medicine

## 2023-06-14 VITALS — BP 111/74 | HR 73 | Temp 98.2°F | Ht 70.5 in | Wt 165.8 lb

## 2023-06-14 DIAGNOSIS — K51911 Ulcerative colitis, unspecified with rectal bleeding: Secondary | ICD-10-CM | POA: Diagnosis not present

## 2023-06-14 DIAGNOSIS — Z0000 Encounter for general adult medical examination without abnormal findings: Secondary | ICD-10-CM

## 2023-06-14 DIAGNOSIS — F339 Major depressive disorder, recurrent, unspecified: Secondary | ICD-10-CM

## 2023-06-14 DIAGNOSIS — Z23 Encounter for immunization: Secondary | ICD-10-CM

## 2023-06-14 DIAGNOSIS — L309 Dermatitis, unspecified: Secondary | ICD-10-CM

## 2023-06-14 MED ORDER — VENLAFAXINE HCL ER 37.5 MG PO CP24: 37.5000 mg | ORAL_CAPSULE | Freq: Every day | ORAL | 0 refills | Status: AC

## 2023-06-14 NOTE — Assessment & Plan Note (Signed)
Referral to dermatology placed today.

## 2023-06-14 NOTE — Assessment & Plan Note (Signed)
Under good control on current regimen. Continue current regimen. Continue to monitor. Call with any concerns. Refills given.

## 2023-06-14 NOTE — Assessment & Plan Note (Signed)
Following with GI. Stable on humiera. Call with any concerns.

## 2023-06-14 NOTE — Progress Notes (Signed)
BP 111/74   Pulse 73   Temp 98.2 F (36.8 C) (Oral)   Ht 5' 10.5" (1.791 m)   Wt 165 lb 12.8 oz (75.2 kg)   SpO2 99%   BMI 23.45 kg/m    Subjective:    Patient ID: Aaron Sawyer, male    DOB: Apr 08, 2001, 23 y.o.   MRN: [id number]  HPI: Aaron Sawyer is a 23 y.o. male presenting on 06/14/2023 for comprehensive medical examination. Current medical complaints include:  DEPRESSION Mood status: better Satisfied with current treatment?: yes Symptom severity: mild  Duration of current treatment : months Side effects: no Medication compliance: excellent compliance Psychotherapy/counseling: yes in the past Previous psychiatric medications: effexor Depressed mood: no Anxious mood: no Anhedonia: no Significant weight loss or gain: no Insomnia: no  Fatigue: yes Feelings of worthlessness or guilt: no Impaired concentration/indecisiveness: no Suicidal ideations: no Hopelessness: no Crying spells: no    06/14/2023   11:24 AM 11/24/2022    4:35 PM 04/06/2020    8:25 AM 02/02/2020    8:50 AM 10/18/2018    3:45 PM  Depression screen PHQ 2/9  Decreased Interest 0 1 0 1 1  Down, Depressed, Hopeless 0 1 0 1 0  PHQ - 2 Score 0 2 0 2 1  Altered sleeping 0 3  0 0  Tired, decreased energy 0 2  1 0  Change in appetite 1 2  0 0  Feeling bad or failure about yourself  0 2  1 0  Trouble concentrating 0 1  0 0  Moving slowly or fidgety/restless 0 0  0 0  Suicidal thoughts 0 0  0 0  PHQ-9 Score 1 12  4 1   Difficult doing work/chores Not difficult at all Somewhat difficult  Somewhat difficult Not difficult at all      06/14/2023   11:24 AM 11/24/2022    4:35 PM 02/02/2020    8:51 AM 10/18/2018    3:44 PM  GAD 7 : Generalized Anxiety Score  Nervous, Anxious, on Edge 0 1 1 0  Control/stop worrying 0 3 1 0  Worry too much - different things 0 3 1 0  Trouble relaxing 0 0 0 0  Restless 0 0 1 0  Easily annoyed or irritable 0 2 0 0  Afraid - awful might happen 0 3 1 0  Total GAD 7 Score 0 12 5 0   Anxiety Difficulty Not difficult at all Somewhat difficult Somewhat difficult Not difficult at all   Interim Problems from his last visit: no  Depression Screen done today and results listed below:     06/14/2023   11:24 AM 11/24/2022    4:35 PM 04/06/2020    8:25 AM 02/02/2020    8:50 AM 10/18/2018    3:45 PM  Depression screen PHQ 2/9  Decreased Interest 0 1 0 1 1  Down, Depressed, Hopeless 0 1 0 1 0  PHQ - 2 Score 0 2 0 2 1  Altered sleeping 0 3  0 0  Tired, decreased energy 0 2  1 0  Change in appetite 1 2  0 0  Feeling bad or failure about yourself  0 2  1 0  Trouble concentrating 0 1  0 0  Moving slowly or fidgety/restless 0 0  0 0  Suicidal thoughts 0 0  0 0  PHQ-9 Score 1 12  4 1   Difficult doing work/chores Not difficult at all Somewhat difficult  Somewhat difficult Not  difficult at all    Past Medical History:  Past Medical History:  Diagnosis Date   ADHD    Allergy    Anemia    Anxiety    Blood transfusion without reported diagnosis    Depression    Eczema    Pneumonia 07/2015   Albuterol, z-pack   Ulcerative colitis (HCC)     Surgical History:  Past Surgical History:  Procedure Laterality Date   NO PAST SURGERIES      Medications:  Current Outpatient Medications on File Prior to Visit  Medication Sig   adalimumab (HUMIRA, 2 PEN,) 40 MG/0.4ML pen Inject into the skin.   ondansetron (ZOFRAN-ODT) 4 MG disintegrating tablet Take 1 tablet (4 mg total) by mouth every 8 (eight) hours as needed.   No current facility-administered medications on file prior to visit.    Allergies:  No Known Allergies  Social History:  Social History   Socioeconomic History   Marital status: Single    Spouse name: Not on file   Number of children: Not on file   Years of education: Not on file   Highest education level: Not on file  Occupational History   Not on file  Tobacco Use   Smoking status: Never   Smokeless tobacco: Never  Vaping Use   Vaping status:  Never Used  Substance and Sexual Activity   Alcohol use: No   Drug use: No   Sexual activity: Not on file  Other Topics Concern   Not on file  Social History Narrative   Not on file   Social Drivers of Health   Financial Resource Strain: Medium Risk (05/02/2021)   Received from [facility], [facility]   Overall Financial Resource Strain (CARDIA)    Difficulty of Paying Living Expenses: Somewhat hard  Food Insecurity: No Food Insecurity (05/02/2021)   Received from [facility], [facility]   Hunger Vital Sign    Worried About Running Out of Food in the Last Year: Never true    Ran Out of Food in the Last Year: Never true  Transportation Needs: No Transportation Needs (01/08/2021)   Received from [facility], [facility]   [facility] - Transportation    Lack of Transportation (Medical): No    Lack of Transportation (Non-Medical): No  Physical Activity: Not on file  Stress: Not on file  Social Connections: Unknown (03/13/2022)   Received from [facility], [facility]   Social Network    Social Network: Not on file  Intimate Partner Violence: Unknown (03/13/2022)   Received from [employer], [facility]   HITS    Physically Hurt: Not on file    Insult or Talk Down To: Not on file    Threaten Physical Harm: Not on file    Scream or Curse: Not on file   Social History   Tobacco Use  Smoking Status Never  Smokeless Tobacco Never   Social History   Substance and Sexual Activity  Alcohol Use No    Family History:  Family History  Problem Relation Age of Onset   Hypertension Father    Cancer Maternal Grandfather    Hypertension Maternal Grandfather    Hypertension Maternal Grandmother    Hypertension Paternal Grandmother    Depression Paternal Grandmother     Past medical history, surgical history, medications, allergies, family history and social history reviewed with patient today and changes made to appropriate areas of the chart.    Review  of Systems  Constitutional: Negative.   HENT: Negative.    Eyes: Negative.   Respiratory: Negative.    Cardiovascular: Negative.   Gastrointestinal:  Positive for blood in stool. Negative for abdominal pain, constipation, diarrhea, heartburn, melena, nausea and vomiting.  Genitourinary: Negative.   Musculoskeletal: Negative.   Skin: Negative.   Neurological: Negative.   Endo/Heme/Allergies: Negative.   Psychiatric/Behavioral: Negative.     All other ROS negative except what is listed above and in the HPI.      Objective:    BP 111/74   Pulse 73   Temp 98.2 F (36.8 C) (Oral)   Ht 5' 10.5" (1.791 m)   Wt 165 lb 12.8 oz (75.2 kg)   SpO2 99%   BMI 23.45 kg/m   Wt Readings from Last 3 Encounters:  06/14/23 165 lb 12.8 oz (75.2 kg)  05/12/23 160 lb (72.6 kg)  11/24/22 158 lb 9.6 oz (71.9 kg)    Physical Exam Vitals and nursing note reviewed.  Constitutional:      General: He is not in acute distress.    Appearance: Normal appearance. He is not ill-appearing, toxic-appearing or diaphoretic.  HENT:     Head: Normocephalic and atraumatic.     Right Ear: Tympanic membrane, ear canal and external ear normal. There is no impacted cerumen.     Left Ear: Tympanic membrane, ear canal and external ear normal. There is no impacted cerumen.     Nose: Nose normal. No congestion or rhinorrhea.     Mouth/Throat:     Mouth: Mucous membranes are moist.     Pharynx: Oropharynx is clear. No oropharyngeal exudate or posterior oropharyngeal erythema.  Eyes:     General: No scleral icterus.       Right eye: No discharge.        Left eye: No discharge.     Extraocular Movements: Extraocular movements intact.     Conjunctiva/sclera: Conjunctivae normal.     Pupils: Pupils are equal, round, and reactive to light.  Neck:     Vascular: No carotid bruit.  Cardiovascular:     Rate and Rhythm: Normal rate and regular rhythm.     Pulses: Normal pulses.     Heart sounds: No murmur  heard.    No friction rub. No gallop.  Pulmonary:     Effort: Pulmonary effort is normal. No respiratory distress.     Breath sounds: Normal breath sounds. No stridor. No wheezing, rhonchi or rales.  Chest:     Chest wall: No tenderness.  Abdominal:     General: Abdomen is flat. Bowel sounds are normal. There is no distension.     Palpations: Abdomen is soft. There is no mass.     Tenderness: There is no abdominal tenderness. There is no right CVA tenderness, left CVA tenderness, guarding or rebound.     Hernia: No hernia is present.  Genitourinary:    Comments: Genital exam deferred with shared decision making Musculoskeletal:        General: No swelling, tenderness, deformity or signs of injury.     Cervical back: Normal range of motion and neck supple. No rigidity. No muscular tenderness.     Right lower leg: No edema.     Left lower leg: No edema.  Lymphadenopathy:     Cervical: No cervical adenopathy.  Skin:    General: Skin is warm and dry.     Capillary Refill: Capillary refill takes less than 2 seconds.  Coloration: Skin is not jaundiced or pale.     Findings: No bruising, erythema, lesion or rash.  Neurological:     General: No focal deficit present.     Mental Status: He is alert and oriented to person, place, and time.     Cranial Nerves: No cranial nerve deficit.     Sensory: No sensory deficit.     Motor: No weakness.     Coordination: Coordination normal.     Gait: Gait normal.     Deep Tendon Reflexes: Reflexes normal.  Psychiatric:        Mood and Affect: Mood normal.        Behavior: Behavior normal.        Thought Content: Thought content normal.        Judgment: Judgment normal.     Results for orders placed or performed during the hospital encounter of 05/12/23  Resp panel by RT-PCR (RSV, Flu A&B, Covid) Anterior Nasal Swab   Collection Time: 05/12/23  9:18 AM   Specimen: Anterior Nasal Swab  Result Value Ref Range   SARS Coronavirus 2 by RT PCR  NEGATIVE NEGATIVE   Influenza A by PCR NEGATIVE NEGATIVE   Influenza B by PCR NEGATIVE NEGATIVE   Resp Syncytial Virus by PCR NEGATIVE NEGATIVE      Assessment & Plan:   Problem List Items Addressed This Visit       Digestive   UC (ulcerative colitis) (HCC)   Following with GI. Stable on humiera. Call with any concerns.        Musculoskeletal and Integument   Eczema   Referral to dermatology placed today.       Relevant Orders   Ambulatory referral to Dermatology     Other   Depression, recurrent (HCC)   Under good control on current regimen. Continue current regimen. Continue to monitor. Call with any concerns. Refills given.        Relevant Medications   venlafaxine XR (EFFEXOR-XR) 37.5 MG 24 hr capsule   Other Visit Diagnoses       Routine physical examination    -  Primary   Vaccines up to date. Screening labs checked today. Continue diet and exercise. Call with any concerns.   Relevant Orders   Comprehensive metabolic panel   CBC with Differential/Platelet   Lipid Panel w/o Chol/HDL Ratio   TSH   HIV Antibody (routine testing w rflx)   GC/Chlamydia Probe Amp     Needs flu shot       Flu shot given today.   Relevant Orders   Flu vaccine trivalent PF, 6mos and older(Flulaval,Afluria,Fluarix,Fluzone) (Completed)        LABORATORY TESTING:  Health maintenance labs ordered today as discussed above.   IMMUNIZATIONS:   - Tdap: Tetanus vaccination status reviewed: last tetanus booster within 10 years. - Influenza: Administered today - Pneumovax: Up to date - Prevnar: Up to date - COVID: Not applicable - HPV: Up to date - Shingrix vaccine: Not applicable   PATIENT COUNSELING:    Sexuality: Discussed sexually transmitted diseases, partner selection, use of condoms, avoidance of unintended pregnancy  and contraceptive alternatives.   Advised to avoid cigarette smoking.  I discussed with the patient that most people either abstain from alcohol or drink  within safe limits (<=14/week and <=4 drinks/occasion for males, <=7/weeks and <= 3 drinks/occasion for females) and that the risk for alcohol disorders and other health effects rises proportionally with the number of drinks per week and  how often a drinker exceeds daily limits.  Discussed cessation/primary prevention of drug use and availability of treatment for abuse.   Diet: Encouraged to adjust caloric intake to maintain  or achieve ideal body weight, to reduce intake of dietary saturated fat and total fat, to limit sodium intake by avoiding high sodium foods and not adding table salt, and to maintain adequate dietary potassium and calcium preferably from fresh fruits, vegetables, and low-fat dairy products.    stressed the importance of regular exercise  Injury prevention: Discussed safety belts, safety helmets, smoke detector, smoking near bedding or upholstery.   Dental health: Discussed importance of regular tooth brushing, flossing, and dental visits.   Follow up plan: NEXT PREVENTATIVE PHYSICAL DUE IN 1 YEAR. Return in about 6 months (around 12/12/2023) for virtual OK.

## 2023-06-15 LAB — CBC WITH DIFFERENTIAL/PLATELET
Basophils Absolute: 0 10*3/uL (ref 0.0–0.2)
Basos: 1 %
EOS (ABSOLUTE): 0.1 10*3/uL (ref 0.0–0.4)
Eos: 1 %
Hematocrit: 50.5 % (ref 37.5–51.0)
Hemoglobin: 16.5 g/dL (ref 13.0–17.7)
Immature Grans (Abs): 0 10*3/uL (ref 0.0–0.1)
Immature Granulocytes: 0 %
Lymphocytes Absolute: 1.2 10*3/uL (ref 0.7–3.1)
Lymphs: 21 %
MCH: 26.7 pg (ref 26.6–33.0)
MCHC: 32.7 g/dL (ref 31.5–35.7)
MCV: 82 fL (ref 79–97)
Monocytes Absolute: 0.6 10*3/uL (ref 0.1–0.9)
Monocytes: 11 %
Neutrophils Absolute: 4 10*3/uL (ref 1.4–7.0)
Neutrophils: 66 %
Platelets: 276 10*3/uL (ref 150–450)
RBC: 6.17 x10E6/uL — ABNORMAL HIGH (ref 4.14–5.80)
RDW: 15.1 % (ref 11.6–15.4)
WBC: 5.9 10*3/uL (ref 3.4–10.8)

## 2023-06-15 LAB — COMPREHENSIVE METABOLIC PANEL
ALT: 14 [IU]/L (ref 0–44)
AST: 22 [IU]/L (ref 0–40)
Albumin: 4.9 g/dL (ref 4.3–5.2)
Alkaline Phosphatase: 77 [IU]/L (ref 44–121)
BUN/Creatinine Ratio: 14 (ref 9–20)
BUN: 15 mg/dL (ref 6–20)
Bilirubin Total: 0.4 mg/dL (ref 0.0–1.2)
CO2: 24 mmol/L (ref 20–29)
Calcium: 9.7 mg/dL (ref 8.7–10.2)
Chloride: 98 mmol/L (ref 96–106)
Creatinine, Ser: 1.08 mg/dL (ref 0.76–1.27)
Globulin, Total: 3.9 g/dL (ref 1.5–4.5)
Glucose: 109 mg/dL — ABNORMAL HIGH (ref 70–99)
Potassium: 4 mmol/L (ref 3.5–5.2)
Sodium: 139 mmol/L (ref 134–144)
Total Protein: 8.8 g/dL — ABNORMAL HIGH (ref 6.0–8.5)
eGFR: 100 mL/min/{1.73_m2} (ref >59)

## 2023-06-15 LAB — LIPID PANEL W/O CHOL/HDL RATIO
Cholesterol, Total: 140 mg/dL (ref 100–199)
HDL: 42 mg/dL (ref >39)
LDL Chol Calc (NIH): 85 mg/dL (ref 0–99)
Triglycerides: 60 mg/dL (ref 0–149)
VLDL Cholesterol Cal: 13 mg/dL (ref 5–40)

## 2023-06-15 LAB — HIV ANTIBODY (ROUTINE TESTING W REFLEX): HIV Screen 4th Generation wRfx: NONREACTIVE

## 2023-06-15 LAB — TSH: TSH: 1.3 u[IU]/mL (ref 0.450–4.500)

## 2023-06-19 LAB — GC/CHLAMYDIA PROBE AMP
Chlamydia trachomatis, NAA: NEGATIVE
Neisseria Gonorrhoeae by PCR: NEGATIVE

## 2023-06-20 ENCOUNTER — Encounter: Payer: Self-pay | Admitting: Family Medicine

## 2023-09-21 ENCOUNTER — Encounter: Payer: Self-pay | Admitting: Family Medicine

## 2023-09-30 DIAGNOSIS — K51013 Ulcerative (chronic) pancolitis with fistula: Secondary | ICD-10-CM | POA: Diagnosis not present

## 2023-10-11 ENCOUNTER — Encounter (HOSPITAL_COMMUNITY): Payer: Self-pay

## 2023-10-11 ENCOUNTER — Ambulatory Visit (HOSPITAL_COMMUNITY)
Admission: EM | Admit: 2023-10-11 | Discharge: 2023-10-11 | Disposition: A | Discharge status: Home / Self Care | Attending: Family Medicine | Admitting: Family Medicine

## 2023-10-11 DIAGNOSIS — K529 Noninfective gastroenteritis and colitis, unspecified: Secondary | ICD-10-CM

## 2023-10-11 MED ORDER — ONDANSETRON 4 MG PO TBDP
4.0000 mg | ORAL_TABLET | Freq: Once | ORAL | Status: AC
  Administered 2023-10-11: 4 mg via ORAL

## 2023-10-11 MED ORDER — ONDANSETRON 4 MG PO TBDP
ORAL_TABLET | ORAL | Status: AC
  Filled 2023-10-11: qty 1

## 2023-10-11 MED ORDER — ONDANSETRON 4 MG PO TBDP: 4.0000 mg | ORAL_TABLET | Freq: Three times a day (TID) | ORAL | 0 refills | Status: AC | PRN

## 2023-10-11 NOTE — ED Triage Notes (Signed)
 Pt c/o fever, chills, and nausea today. States needs a work note. Denies taken any meds.

## 2023-10-11 NOTE — ED Provider Notes (Signed)
 [facility]    CSN: [id number] Arrival date & time: 10/11/23  1250      History   Chief Complaint Chief Complaint  Patient presents with   Fever    HPI Aaron Sawyer is a 23 y.o. male.    Fever Here for fever and chills and nausea.  Symptoms began overnight last night.  No diarrhea.  Last bowel movement was this morning though it was small.  He has not had any vomiting so far.  NKDA  Past medical history significant for ulcerative colitis and he takes Humira injections.  No blood in the stool recently.  Past Medical History:  Diagnosis Date   ADHD    Allergy    Anemia    Anxiety    Blood transfusion without reported diagnosis    Depression    Eczema    Pneumonia 07/2015   Albuterol, z-pack   Ulcerative colitis [facility])     Patient Active Problem List   Diagnosis Date Noted   Eczema 06/14/2023   Depression, recurrent (HCC) 11/28/2022   UC (ulcerative colitis) (HCC) 12/08/2016   ADD (attention deficit disorder) 12/08/2016   Grief 12/08/2016   Iron deficiency anemia 11/29/2016    Past Surgical History:  Procedure Laterality Date   NO PAST SURGERIES         Home Medications    Prior to Admission medications   Medication Sig Start Date End Date Taking? Authorizing Provider  ondansetron  (ZOFRAN -ODT) 4 MG disintegrating tablet Take 1 tablet (4 mg total) by mouth every 8 (eight) hours as needed for nausea or vomiting. 10/11/23  Yes Shizuo Biskup K, MD  adalimumab (HUMIRA, 2 PEN,) 40 MG/0.4ML pen Inject into the skin. 12/14/22   [provider]  venlafaxine  XR (EFFEXOR -XR) 37.5 MG 24 hr capsule Take 1 capsule (37.5 mg total) by mouth daily with breakfast. 06/14/23   Solomon Dupre, DO    Family History Family History  Problem Relation Age of Onset   Hypertension Father    Cancer Maternal Grandfather    Hypertension Maternal Grandfather    Hypertension Maternal Grandmother    Hypertension Paternal Grandmother    Depression Paternal  Grandmother     Social History Social History   Tobacco Use   Smoking status: Never   Smokeless tobacco: Never  Vaping Use   Vaping status: Never Used  Substance Use Topics   Alcohol use: No   Drug use: No     Allergies   Patient has no known allergies.   Review of Systems Review of Systems  Constitutional:  Positive for fever.     Physical Exam Triage Vital Signs ED Triage Vitals [10/11/23 1324]  Encounter Vitals Group     BP 127/79     Systolic BP Percentile      Diastolic BP Percentile      Pulse Rate (!) 112     Resp 16     Temp 100 F (37.8 C)     Temp Source Oral     SpO2      Weight      Height      Head Circumference      Peak Flow      Pain Score 6     Pain Loc      Pain Education      Exclude from Growth Chart    No data found.  Updated Vital Signs BP 127/79 (BP Location: Right Arm)   Pulse (!) 112  Temp 100 F (37.8 C) (Oral)   Resp 16   Visual Acuity Right Eye Distance:   Left Eye Distance:   Bilateral Distance:    Right Eye Near:   Left Eye Near:    Bilateral Near:     Physical Exam Vitals reviewed.  Constitutional:      General: He is not in acute distress.    Appearance: He is not ill-appearing, toxic-appearing or diaphoretic.  HENT:     Mouth/Throat:     Mouth: Mucous membranes are moist.  Eyes:     Extraocular Movements: Extraocular movements intact.     Conjunctiva/sclera: Conjunctivae normal.     Pupils: Pupils are equal, round, and reactive to light.  Cardiovascular:     Rate and Rhythm: Regular rhythm. Tachycardia present.     Heart sounds: No murmur heard. Pulmonary:     Effort: Pulmonary effort is normal.     Breath sounds: Normal breath sounds.  Abdominal:     General: Bowel sounds are normal. There is no distension.     Palpations: Abdomen is soft.     Tenderness: There is no abdominal tenderness. There is no guarding.  Skin:    Coloration: Skin is not jaundiced or pale.  Neurological:     General:  No focal deficit present.     Mental Status: He is alert and oriented to person, place, and time.  Psychiatric:        Behavior: Behavior normal.      UC Treatments / Results  Labs (all labs ordered are listed, but only abnormal results are displayed) Labs Reviewed - No data to display  EKG   Radiology No results found.  Procedures Procedures (including critical care time)  Medications Ordered in UC Medications  ondansetron  (ZOFRAN -ODT) disintegrating tablet 4 mg (has no administration in time range)    Initial Impression / Assessment and Plan / UC Course  I have reviewed the triage vital signs and the nursing notes.  Pertinent labs & imaging results that were available during my care of the patient were reviewed by me and considered in my medical decision making (see chart for details).     Zofran  is given here and a prescription for Zofran  is sent to the pharmacy.  Work note is provided We discussed that I think this is most likely a gastroenteritis/viral illness causing his symptoms. Final Clinical Impressions(s) / UC Diagnoses   Final diagnoses:  Gastroenteritis     Discharge Instructions      Ondansetron  dissolved in the mouth every 8 hours as needed for nausea or vomiting. Clear liquids(water, gatorade/pedialyte, ginger ale/sprite, chicken broth/soup) and bland things(crackers/toast, rice, potato, bananas) to eat. Avoid acidic foods like lemon/lime/orange/tomato, and avoid greasy/spicy foods.  We have given you 1 dose of this medication in the clinic.   ED Prescriptions     Medication Sig Dispense Auth. Provider   ondansetron  (ZOFRAN -ODT) 4 MG disintegrating tablet Take 1 tablet (4 mg total) by mouth every 8 (eight) hours as needed for nausea or vomiting. 10 tablet Ellsworth Haas Kelie Gainey K, MD      PDMP not reviewed this encounter.   Ann Keto, MD 10/11/23 [phone]

## 2023-10-11 NOTE — Discharge Instructions (Signed)
 Ondansetron  dissolved in the mouth every 8 hours as needed for nausea or vomiting. Clear liquids(water, gatorade/pedialyte, ginger ale/sprite, chicken broth/soup) and bland things(crackers/toast, rice, potato, bananas) to eat. Avoid acidic foods like lemon/lime/orange/tomato, and avoid greasy/spicy foods.  We have given you 1 dose of this medication in the clinic.

## 2023-12-13 ENCOUNTER — Ambulatory Visit: Admitting: Family Medicine

## 2023-12-19 ENCOUNTER — Ambulatory Visit: Payer: Self-pay | Admitting: Family Medicine

## 2023-12-20 DIAGNOSIS — J029 Acute pharyngitis, unspecified: Secondary | ICD-10-CM | POA: Diagnosis not present

## 2024-01-08 DIAGNOSIS — Z0000 Encounter for general adult medical examination without abnormal findings: Secondary | ICD-10-CM | POA: Diagnosis not present

## 2024-01-08 DIAGNOSIS — F332 Major depressive disorder, recurrent severe without psychotic features: Secondary | ICD-10-CM | POA: Diagnosis not present

## 2024-01-08 DIAGNOSIS — F419 Anxiety disorder, unspecified: Secondary | ICD-10-CM | POA: Diagnosis not present

## 2024-01-08 DIAGNOSIS — K51919 Ulcerative colitis, unspecified with unspecified complications: Secondary | ICD-10-CM | POA: Diagnosis not present

## 2024-01-08 DIAGNOSIS — Z1159 Encounter for screening for other viral diseases: Secondary | ICD-10-CM | POA: Diagnosis not present

## 2024-02-07 ENCOUNTER — Ambulatory Visit: Admitting: Family Medicine

## 2024-03-02 DIAGNOSIS — K51911 Ulcerative colitis, unspecified with rectal bleeding: Secondary | ICD-10-CM | POA: Diagnosis not present

## 2024-03-13 DIAGNOSIS — K50113 Crohn's disease of large intestine with fistula: Secondary | ICD-10-CM | POA: Diagnosis not present

## 2024-04-10 DIAGNOSIS — J020 Streptococcal pharyngitis: Secondary | ICD-10-CM | POA: Diagnosis not present

## 2024-04-10 DIAGNOSIS — R070 Pain in throat: Secondary | ICD-10-CM | POA: Diagnosis not present

## 2024-04-10 DIAGNOSIS — Z113 Encounter for screening for infections with a predominantly sexual mode of transmission: Secondary | ICD-10-CM | POA: Diagnosis not present
